# Patient Record
Sex: Male | Born: 1995 | Race: White | Hispanic: No | Marital: Single | Smoking: Former smoker
Health system: Southern US, Community
[De-identification: ages and names within clinical notes are randomized; demographics above are authoritative.]

## PROBLEM LIST (undated history)

## (undated) ENCOUNTER — Ambulatory Visit: Admission: EM | Payer: Medicaid Other | Source: Home / Self Care

## (undated) ENCOUNTER — Emergency Department (HOSPITAL_COMMUNITY): Payer: Medicaid Other

## (undated) DIAGNOSIS — F419 Anxiety disorder, unspecified: Secondary | ICD-10-CM

## (undated) DIAGNOSIS — F988 Other specified behavioral and emotional disorders with onset usually occurring in childhood and adolescence: Secondary | ICD-10-CM

## (undated) DIAGNOSIS — E783 Hyperchylomicronemia: Secondary | ICD-10-CM

## (undated) DIAGNOSIS — F445 Conversion disorder with seizures or convulsions: Secondary | ICD-10-CM

## (undated) DIAGNOSIS — J45909 Unspecified asthma, uncomplicated: Secondary | ICD-10-CM

## (undated) HISTORY — PX: WISDOM TOOTH EXTRACTION: SHX21

---

## 2018-11-29 ENCOUNTER — Emergency Department (HOSPITAL_BASED_OUTPATIENT_CLINIC_OR_DEPARTMENT_OTHER): Payer: Medicaid Other

## 2018-11-29 ENCOUNTER — Emergency Department (HOSPITAL_BASED_OUTPATIENT_CLINIC_OR_DEPARTMENT_OTHER)
Admission: EM | Admit: 2018-11-29 | Discharge: 2018-11-29 | Disposition: A | Discharge status: Home / Self Care | Payer: Medicaid Other | Attending: Emergency Medicine | Admitting: Emergency Medicine

## 2018-11-29 ENCOUNTER — Other Ambulatory Visit: Payer: Self-pay

## 2018-11-29 ENCOUNTER — Encounter (HOSPITAL_BASED_OUTPATIENT_CLINIC_OR_DEPARTMENT_OTHER): Payer: Self-pay | Admitting: Emergency Medicine

## 2018-11-29 DIAGNOSIS — R0602 Shortness of breath: Secondary | ICD-10-CM | POA: Diagnosis present

## 2018-11-29 DIAGNOSIS — U071 COVID-19: Secondary | ICD-10-CM

## 2018-11-29 HISTORY — DX: Other specified behavioral and emotional disorders with onset usually occurring in childhood and adolescence: F98.8

## 2018-11-29 HISTORY — DX: Conversion disorder with seizures or convulsions: F44.5

## 2018-11-29 HISTORY — DX: Hyperchylomicronemia: E78.3

## 2018-11-29 HISTORY — DX: Anxiety disorder, unspecified: F41.9

## 2018-11-29 MED ORDER — PREDNISONE 20 MG PO TABS: ORAL_TABLET | ORAL | 0 refills | Status: DC

## 2018-11-29 MED ORDER — AZITHROMYCIN 250 MG PO TABS: ORAL_TABLET | ORAL | 0 refills | Status: DC

## 2018-11-29 NOTE — Discharge Instructions (Addendum)
Take the medications as prescribed.  Make sure that you are quarantining at home for at least 10 days and at least 3 days after improvement of symptoms.  Use your albuterol inhaler as needed.  Return to emergency room if you have any worsening shortness of breath or other worsening symptoms.

## 2018-11-29 NOTE — ED Provider Notes (Signed)
[facility] EMERGENCY DEPARTMENT Provider Note   CSN: [id number] Arrival date & time: 11/29/18  1957    History   Chief Complaint Chief Complaint  Patient presents with  . Shortness of Breath    HPI Brett Larsen is a 23 y.o. male.     Patient is a 23 year old male who presents with shortness of breath.  He was recently diagnosed with COVID 2 days ago.  He has had symptoms of cough and fatigue for about the last 3 days.  He does have a history of asthma.  He had some increased shortness of breath today and he was using his [name] without significant improvement in symptoms.  He denies any known fevers.  No vomiting.  No leg swelling.     Past Medical History:  Diagnosis Date  . ADD (attention deficit disorder)   . Chronic anxiety   . Mixed hyperglyceridemia   . Psychogenic nonepileptic seizure     There are no active problems to display for this patient.   The histories are not reviewed yet. Please review them in the "History" navigator section and refresh this [place].      Home Medications    Prior to Admission medications   Medication Sig Start Date End Date Taking? Authorizing Provider  azithromycin (ZITHROMAX Z-PAK) 250 MG tablet 2 po day one, then 1 daily x 4 days 11/29/18   Malvin Johns, MD  predniSONE (DELTASONE) 20 MG tablet 3 tabs po day one, then 2 po daily x 4 days 11/29/18   Malvin Johns, MD    Family History History reviewed. No pertinent family history.  Social History Social History   Tobacco Use  . Smoking status: Not on file  Substance Use Topics  . Alcohol use: Not on file  . Drug use: Not on file     Allergies   Patient has no allergy information on record.   Review of Systems Review of Systems  Constitutional: Positive for fatigue. Negative for chills, diaphoresis and fever.  HENT: Negative for congestion, rhinorrhea and sneezing.   Eyes: Negative.   Respiratory: Positive for shortness of breath. Negative for  cough and chest tightness.   Cardiovascular: Negative for chest pain and leg swelling.  Gastrointestinal: Negative for abdominal pain, blood in stool, diarrhea, nausea and vomiting.  Genitourinary: Negative for difficulty urinating, flank pain, frequency and hematuria.  Musculoskeletal: Positive for myalgias. Negative for arthralgias and back pain.  Skin: Negative for rash.  Neurological: Negative for dizziness, speech difficulty, weakness, numbness and headaches.     Physical Exam Updated Vital Signs BP (!) 141/81 (BP Location: Right Arm)   Pulse 93   Temp 98.9 F (37.2 C) (Oral)   Resp 16   Ht 5\' 8"  (1.727 m)   Wt 113.4 kg   SpO2 97%   BMI 38.01 kg/m   Physical Exam Constitutional:      Appearance: He is well-developed.  HENT:     Head: Normocephalic and atraumatic.  Eyes:     Pupils: Pupils are equal, round, and reactive to light.  Neck:     Musculoskeletal: Normal range of motion and neck supple.  Cardiovascular:     Rate and Rhythm: Normal rate and regular rhythm.     Heart sounds: Normal heart sounds.  Pulmonary:     Effort: Pulmonary effort is normal. No respiratory distress.     Breath sounds: Normal breath sounds. No wheezing or rales.  Chest:     Chest wall: No tenderness.  Abdominal:     General: Bowel sounds are normal.     Palpations: Abdomen is soft.     Tenderness: There is no abdominal tenderness. There is no guarding or rebound.  Musculoskeletal: Normal range of motion.     Right lower leg: No edema.     Left lower leg: No edema.  Lymphadenopathy:     Cervical: No cervical adenopathy.  Skin:    General: Skin is warm and dry.     Findings: No rash.  Neurological:     Mental Status: He is alert and oriented to person, place, and time.      ED Treatments / Results  Labs (all labs ordered are listed, but only abnormal results are displayed) Labs Reviewed - No data to display  EKG None  [facility]  Result Date:  11/29/2018 CLINICAL DATA:  Shortness of breath.  COVID-19 positive. EXAM: PORTABLE CHEST 1 VIEW COMPARISON:  None. FINDINGS: The cardiomediastinal contours are normal. Minimal streaky left lung base opacities. Pulmonary vasculature is normal. No consolidation, pleural effusion, or pneumothorax. No acute osseous abnormalities are seen. IMPRESSION: Minimal streaky left lung base opacities, may represent atelectasis or known COVID-19 pneumonia. Electronically Signed   By: Narda RutherfordMelanie  Sanford M.D.   On: 11/29/2018 21:34    Procedures Procedures (including critical care time)  Medications Ordered in ED Medications - No data to display   Initial Impression / Assessment and Plan / ED Course  I have reviewed the triage vital signs and the nursing notes.  Pertinent labs & imaging results that were available during my care of the patient were reviewed by me and considered in my medical decision making (see chart for details).        Patient presents with shortness of breath after recently diagnosed COVID infection.  His chest x-ray shows some slight lower lobe patchy infiltrates.  He has normal oxygen saturation.  No tachypnea or increased work of breathing.  His lungs are clear.  I will start him on Zithromax and a prednisone burst.  He will continue using his albuterol at home as needed.  Return precautions were given.  Final Clinical Impressions(s) / ED Diagnoses   Final diagnoses:  COVID-19 virus infection    ED Discharge Orders         Ordered    azithromycin (ZITHROMAX Z-PAK) 250 MG tablet     11/29/18 2203    predniSONE (DELTASONE) 20 MG tablet     11/29/18 2203           Brett Larsen, Brett Heiner, MD 11/29/18 2205

## 2018-11-29 NOTE — ED Triage Notes (Signed)
Patient here with c/o SOB and light headedness. States he tested positive for COVID this past Friday. Hx of asthma. NAD. Afebrile since yesterday.

## 2018-11-29 NOTE — ED Notes (Signed)
Patient verbalizes understanding of discharge instructions. Opportunity for questioning and answers were provided. Armband removed by staff, pt discharged from ED.  

## 2020-01-12 ENCOUNTER — Emergency Department (HOSPITAL_COMMUNITY)
Admission: EM | Admit: 2020-01-12 | Discharge: 2020-01-12 | Disposition: A | Discharge status: Home / Self Care | Payer: Medicaid Other | Attending: Emergency Medicine | Admitting: Emergency Medicine

## 2020-01-12 ENCOUNTER — Emergency Department (HOSPITAL_COMMUNITY): Payer: Medicaid Other

## 2020-01-12 ENCOUNTER — Encounter (HOSPITAL_COMMUNITY): Payer: Self-pay

## 2020-01-12 DIAGNOSIS — Y929 Unspecified place or not applicable: Secondary | ICD-10-CM | POA: Insufficient documentation

## 2020-01-12 DIAGNOSIS — Z79899 Other long term (current) drug therapy: Secondary | ICD-10-CM | POA: Insufficient documentation

## 2020-01-12 DIAGNOSIS — W230XXA Caught, crushed, jammed, or pinched between moving objects, initial encounter: Secondary | ICD-10-CM | POA: Insufficient documentation

## 2020-01-12 DIAGNOSIS — Y999 Unspecified external cause status: Secondary | ICD-10-CM | POA: Insufficient documentation

## 2020-01-12 DIAGNOSIS — S60012A Contusion of left thumb without damage to nail, initial encounter: Secondary | ICD-10-CM | POA: Insufficient documentation

## 2020-01-12 DIAGNOSIS — Y939 Activity, unspecified: Secondary | ICD-10-CM | POA: Insufficient documentation

## 2020-01-12 DIAGNOSIS — S60932A Unspecified superficial injury of left thumb, initial encounter: Secondary | ICD-10-CM | POA: Diagnosis present

## 2020-01-12 NOTE — Discharge Instructions (Addendum)
Wear splint for comfort. Apply ice for 20 minutes at a time and elevate. Take Motrin and Tylenol as needed as directed. Follow up with orthopedics if not improving in 1 week.

## 2020-01-12 NOTE — ED Triage Notes (Signed)
Pt states that he slammed his left hand in the car door this am

## 2020-01-12 NOTE — ED Provider Notes (Signed)
[facility]-EMERGENCY DEPT Provider Note   CSN: [id number] Arrival date & time: 01/12/20  0542     History No chief complaint on file.   Brett Larsen is a 24 y.o. male.  24 year old right-hand-dominant male presents with injury to the left thumb.  Patient states that he closed his hand in the car door today and had to open the door to get his hand back out.  Pain is located at the distal left thumb.  No other injuries or concerns.        Past Medical History:  Diagnosis Date  . ADD (attention deficit disorder)   . Chronic anxiety   . Mixed hyperglyceridemia   . Psychogenic nonepileptic seizure     There are no problems to display for this patient.   History reviewed. No pertinent surgical history.     History reviewed. No pertinent family history.  Social History   Tobacco Use  . Smoking status: Never Smoker  . Smokeless tobacco: Never Used  Substance Use Topics  . Alcohol use: Never  . Drug use: Not on file    Home Medications Prior to Admission medications   Medication Sig Start Date End Date Taking? Authorizing Provider  azithromycin (ZITHROMAX Z-PAK) 250 MG tablet 2 po day one, then 1 daily x 4 days 11/29/18   Rolan Bucco, MD  predniSONE (DELTASONE) 20 MG tablet 3 tabs po day one, then 2 po daily x 4 days 11/29/18   Rolan Bucco, MD    Allergies    Patient has no allergy information on record.  Review of Systems   Review of Systems  Constitutional: Negative for fever.  Musculoskeletal: Positive for arthralgias, joint swelling and myalgias.  Skin: Negative for color change, rash and wound.  Allergic/Immunologic: Negative for immunocompromised state.  Neurological: Negative for weakness and numbness.  Hematological: Does not bruise/bleed easily.  Psychiatric/Behavioral: Negative for self-injury.  All other systems reviewed and are negative.   Physical Exam Updated Vital Signs BP 139/83   Pulse 65   Temp 97.8 F (36.6 C)  (Oral)   Resp 18   Ht 5\' 8"  (1.727 m)   Wt 86.2 kg   SpO2 93%   BMI 28.89 kg/m   Physical Exam Vitals and nursing note reviewed.  Constitutional:      General: He is not in acute distress.    Appearance: He is well-developed. He is not diaphoretic.  HENT:     Head: Normocephalic and atraumatic.  Cardiovascular:     Pulses: Normal pulses.  Pulmonary:     Effort: Pulmonary effort is normal.  Musculoskeletal:        General: Swelling and tenderness present. No deformity.     Comments: Tenderness to distal left thumb with mild swelling, slight petechia noted across left nail bed and palmar aspect. No subungual hematoma.   Skin:    General: Skin is warm and dry.     Capillary Refill: Capillary refill takes less than 2 seconds.     Findings: Bruising present. No erythema or rash.  Neurological:     Mental Status: He is alert and oriented to person, place, and time.     Sensory: No sensory deficit.  Psychiatric:        Behavior: Behavior normal.     ED Results / Procedures / Treatments   Labs (all labs ordered are listed, but only abnormal results are displayed) Labs Reviewed - No data to display  EKG None  Radiology DG Hand Complete  Left  Result Date: 01/12/2020 CLINICAL DATA:  Injury. EXAM: LEFT HAND - COMPLETE 3+ VIEW COMPARISON:  No prior. FINDINGS: No evidence of displaced fracture or dislocation. No radiopaque foreign body. IMPRESSION: No acute abnormality identified. Electronically Signed   By: Maisie Fus  Register   On: 01/12/2020 07:16    Procedures Procedures (including critical care time)  Medications Ordered in ED Medications - No data to display  ED Course  I have reviewed the triage vital signs and the nursing notes.  Pertinent labs & imaging results that were available during my care of the patient were reviewed by me and considered in my medical decision making (see chart for details).  Clinical Course as of Jan 11 837  Thu Jan 12, 2020  1323  24 year old right-hand-dominant male with injury to the left distal thumb.  On exam has slight petechia/early bruising across the nailbed and palmar aspect of the distal digit with mild swelling and tenderness. X-ray is negative for fracture.  Thumb will be splinted, recommend ice and elevate as well as Motrin and Tylenol, follow-up with orthopedics in 1 week if not improving.   [LM]    Clinical Course User Index [LM] Alden Hipp   MDM Rules/Calculators/A&P                          Final Clinical Impression(s) / ED Diagnoses Final diagnoses:  Contusion of left thumb without damage to nail, initial encounter    Rx / DC Orders ED Discharge Orders    None       Jeannie Fend, PA-C 01/12/20 5809    Maia Plan, MD 01/12/20 1052

## 2020-03-20 ENCOUNTER — Other Ambulatory Visit: Payer: Self-pay

## 2020-03-20 ENCOUNTER — Emergency Department (HOSPITAL_COMMUNITY)
Admission: EM | Admit: 2020-03-20 | Discharge: 2020-03-20 | Disposition: A | Discharge status: Home / Self Care | Payer: Medicaid Other | Attending: Emergency Medicine | Admitting: Emergency Medicine

## 2020-03-20 ENCOUNTER — Encounter (HOSPITAL_COMMUNITY): Payer: Self-pay | Admitting: *Deleted

## 2020-03-20 ENCOUNTER — Emergency Department (HOSPITAL_COMMUNITY): Payer: Medicaid Other

## 2020-03-20 DIAGNOSIS — F419 Anxiety disorder, unspecified: Secondary | ICD-10-CM | POA: Diagnosis not present

## 2020-03-20 DIAGNOSIS — Z87891 Personal history of nicotine dependence: Secondary | ICD-10-CM | POA: Diagnosis not present

## 2020-03-20 DIAGNOSIS — R0602 Shortness of breath: Secondary | ICD-10-CM | POA: Insufficient documentation

## 2020-03-20 DIAGNOSIS — R0789 Other chest pain: Secondary | ICD-10-CM | POA: Insufficient documentation

## 2020-03-20 DIAGNOSIS — R0600 Dyspnea, unspecified: Secondary | ICD-10-CM

## 2020-03-20 LAB — BASIC METABOLIC PANEL
Anion gap: 8 (ref 5–15)
BUN: 13 mg/dL (ref 6–20)
CO2: 25 mmol/L (ref 22–32)
Calcium: 9.7 mg/dL (ref 8.9–10.3)
Chloride: 107 mmol/L (ref 98–111)
Creatinine, Ser: 0.87 mg/dL (ref 0.61–1.24)
GFR, Estimated: >60 mL/min (ref >60)
Glucose, Bld: 83 mg/dL (ref 70–99)
Potassium: 4 mmol/L (ref 3.5–5.1)
Sodium: 140 mmol/L (ref 135–145)

## 2020-03-20 LAB — CBC
HCT: 44.7 % (ref 39.0–52.0)
Hemoglobin: 14.6 g/dL (ref 13.0–17.0)
MCH: 31.2 pg (ref 26.0–34.0)
MCHC: 32.7 g/dL (ref 30.0–36.0)
MCV: 95.5 fL (ref 80.0–100.0)
Platelets: 217 10*3/uL (ref 150–400)
RBC: 4.68 MIL/uL (ref 4.22–5.81)
RDW: 12.7 % (ref 11.5–15.5)
WBC: 6.2 10*3/uL (ref 4.0–10.5)
nRBC: 0 % (ref 0.0–0.2)

## 2020-03-20 LAB — TROPONIN I (HIGH SENSITIVITY): Troponin I (High Sensitivity): 3 ng/L (ref <18)

## 2020-03-20 NOTE — Discharge Instructions (Addendum)
Use your inhaler as directed if you become short of breath.  Keep your appointment with your doctor this week

## 2020-03-20 NOTE — ED Provider Notes (Signed)
MSE was initiated and I personally evaluated the patient and placed orders (if any) at  3:02 PM on March 20, 2020.  The patient appears stable so that the remainder of the MSE may be completed by another provider. 24 year old man seen at triage due to abnormal EKG.  Patient states that he has had shortness of breath over months although it has worsened over the past couple weeks.  Today he has had some sharp substernal chest pain.  EKG is abnormal with ST elevation in 2 3 aVF and across precordium.  This appears most consistent with early repolarization however there are no old EKGs to compare. Patient is having some chest pain. EKG reviewed via telephone 3 nursing staff with Dr. [place] who is concurrence with EKG was consistent with early repolarization.   Margarita Grizzle, MD 03/20/20 [id number]

## 2020-03-20 NOTE — ED Triage Notes (Addendum)
Pt states he has been having SHOB for 4-5 months, at school trying to finish project today and noticed shob was worse.No noted distress in triage resp 14-16.   Pt goes into more detail with Dr Rosalia Hammers stating he has chest pain that become worse as his shob increased.

## 2020-03-20 NOTE — ED Provider Notes (Signed)
[facility]-EMERGENCY DEPT Provider Note   CSN: [id number] Arrival date & time: 03/20/20  1355     History Chief Complaint  Patient presents with  . Shortness of Breath    Brett Larsen is a 24 y.o. male.  24 year old male who presents with increasing shortness of breath as well as some chest tightness.  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  He has been having these symptoms now for several months.  Is concerned about exposure to black mold in his apartment.  Denies any exertional component to this.  No lower extremity edema.  Patient states he works out every morning and has been able to continue to do this.  He has had an intentional 100 pound weight loss recently.  Patient states he also suffers from anxiety as well.  Symptoms wax and wane and resolved without treatment usually.  He also has a remote history of asthma and does have a inhaler which she did not use.  Chest tightness described as diffuse in nature without anginal type qualities.        Past Medical History:  Diagnosis Date  . ADD (attention deficit disorder)   . Chronic anxiety   . Mixed hyperglyceridemia   . Psychogenic nonepileptic seizure     There are no problems to display for this patient.   History reviewed. No pertinent surgical history.     No family history on file.  Social History   Tobacco Use  . Smoking status: Former Games developer  . Smokeless tobacco: Never Used  Substance Use Topics  . Alcohol use: Never  . Drug use: Never    Home Medications Prior to Admission medications   Medication Sig Start Date End Date Taking? Authorizing Provider  azithromycin (ZITHROMAX Z-PAK) 250 MG tablet 2 po day one, then 1 daily x 4 days 11/29/18   Rolan Bucco, MD  predniSONE (DELTASONE) 20 MG tablet 3 tabs po day one, then 2 po daily x 4 days 11/29/18   Rolan Bucco, MD    Allergies    Patient has no allergy information on  record.  Review of Systems   Review of Systems  All other systems reviewed and are negative.   Physical Exam Updated Vital Signs BP 130/74 (BP Location: Right Arm)   Pulse 64   Temp 98.4 F (36.9 C) (Oral)   Resp 16   Ht 1.727 m (5\' 8" )   Wt 73.9 kg   SpO2 98%   BMI 24.78 kg/m   Physical Exam Vitals and nursing note reviewed.  Constitutional:      General: He is not in acute distress.    Appearance: Normal appearance. He is well-developed. He is not toxic-appearing.  HENT:     Head: Normocephalic and atraumatic.  Eyes:     General: Lids are normal.     Conjunctiva/sclera: Conjunctivae normal.     Pupils: Pupils are equal, round, and reactive to light.  Neck:     Thyroid: No thyroid mass.     Trachea: No tracheal deviation.  Cardiovascular:     Rate and Rhythm: Normal rate and regular rhythm.     Heart sounds: Normal heart sounds. No murmur heard.  No gallop.   Pulmonary:     Effort: Pulmonary effort is normal. No respiratory distress.     Breath sounds: Normal breath sounds. No stridor. No decreased breath sounds, wheezing, rhonchi or rales.  Abdominal:  General: Bowel sounds are normal. There is no distension.     Palpations: Abdomen is soft.     Tenderness: There is no abdominal tenderness. There is no rebound.  Musculoskeletal:        General: No tenderness. Normal range of motion.     Cervical back: Normal range of motion and neck supple.  Skin:    General: Skin is warm and dry.     Findings: No abrasion or rash.  Neurological:     Mental Status: He is alert and oriented to person, place, and time.     GCS: GCS eye subscore is 4. GCS verbal subscore is 5. GCS motor subscore is 6.     Cranial Nerves: No cranial nerve deficit.     Sensory: No sensory deficit.  Psychiatric:        Speech: Speech normal.        Behavior: Behavior normal.     ED Results / Procedures / Treatments   Labs (all labs ordered are listed, but only abnormal results are  displayed) Labs Reviewed  CBC  BASIC METABOLIC PANEL  TROPONIN I (HIGH SENSITIVITY)    EKG EKG Interpretation  Date/Time:  Tuesday March 20 2020 14:02:42 EST Ventricular Rate:  63 PR Interval:    QRS Duration: 91 QT Interval:  390 QTC Calculation: 400 R Axis:   -57 Text Interpretation: Sinus rhythm likely early repol No old tracing to compare Confirmed by Margarita Grizzle [phone]) on 03/20/2020 2:18:50 PM   Radiology DG Chest Portable 1 View  Result Date: 03/20/2020 CLINICAL DATA:  24 year old male with shortness of breath. EXAM: PORTABLE CHEST 1 VIEW COMPARISON:  None. FINDINGS: The heart size and mediastinal contours are within normal limits. Both lungs are clear. The visualized skeletal structures are unremarkable. IMPRESSION: No acute cardiopulmonary process. Electronically Signed   By: Marliss Coots MD   On: 03/20/2020 14:33    Procedures Procedures (including critical care time)  Medications Ordered in ED Medications - No data to display  ED Course  I have reviewed the triage vital signs and the nursing notes.  Pertinent labs & imaging results that were available during my care of the patient were reviewed by me and considered in my medical decision making (see chart for details).    MDM Rules/Calculators/A&P                          Patient is EKG no dentures early repolarization.  Labs are reassuring including negative troponin.  Chest x-ray is normal as well 2.  Vital signs are stable.  Suspect some element of bronchospasm.  Have encouraged patient to use inhaler when he gets short of breath and to keep his follow-up appointment with his doctor later this week Final Clinical Impression(s) / ED Diagnoses Final diagnoses:  None    Rx / DC Orders ED Discharge Orders    None       Lorre Nick, MD 03/20/20 1638

## 2021-02-14 ENCOUNTER — Emergency Department (HOSPITAL_COMMUNITY)
Admission: EM | Admit: 2021-02-14 | Discharge: 2021-02-14 | Disposition: A | Discharge status: Home / Self Care | Payer: Medicaid Other | Attending: Emergency Medicine | Admitting: Emergency Medicine

## 2021-02-14 ENCOUNTER — Emergency Department (HOSPITAL_COMMUNITY): Payer: Medicaid Other

## 2021-02-14 ENCOUNTER — Encounter (HOSPITAL_COMMUNITY): Payer: Self-pay

## 2021-02-14 ENCOUNTER — Other Ambulatory Visit: Payer: Self-pay

## 2021-02-14 DIAGNOSIS — S62344A Nondisplaced fracture of base of fourth metacarpal bone, right hand, initial encounter for closed fracture: Secondary | ICD-10-CM | POA: Diagnosis not present

## 2021-02-14 DIAGNOSIS — Y9239 Other specified sports and athletic area as the place of occurrence of the external cause: Secondary | ICD-10-CM | POA: Diagnosis not present

## 2021-02-14 DIAGNOSIS — S6991XA Unspecified injury of right wrist, hand and finger(s), initial encounter: Secondary | ICD-10-CM | POA: Diagnosis present

## 2021-02-14 DIAGNOSIS — W228XXA Striking against or struck by other objects, initial encounter: Secondary | ICD-10-CM | POA: Insufficient documentation

## 2021-02-14 DIAGNOSIS — Z87891 Personal history of nicotine dependence: Secondary | ICD-10-CM | POA: Diagnosis not present

## 2021-02-14 NOTE — ED Notes (Signed)
Ortho tech bedside 

## 2021-02-14 NOTE — ED Triage Notes (Signed)
Pt stated he was using gym equipment and hurt his right hand. Some slight swelling noted to the top of right hand. Pt states its painful and hard to move his hand.

## 2021-02-14 NOTE — ED Notes (Signed)
An After Visit Summary was printed and given to the patient. Discharge instructions given and no further questions at this time.  

## 2021-02-14 NOTE — ED Provider Notes (Signed)
[facility]-EMERGENCY DEPT Provider Note   CSN: [id number] Arrival date & time: 02/14/21  1639     History Chief Complaint  Patient presents with   Right Hand Pain    Brett Larsen is a 25 y.o. male.  HPI Patient is a 25 year old male with a medical history as noted below.  Brett Larsen presents to the emergency department due to right hand pain.  Patient states that while at the gym Brett Larsen accidentally punched a wall with a closed right fist.  Reports immediate pain that Brett Larsen states is worse along the medial dorsum of the hand.  No numbness.  No other complaints.  Brett Larsen is right-hand dominant.    Past Medical History:  Diagnosis Date   ADD (attention deficit disorder)    Chronic anxiety    Mixed hyperglyceridemia    Psychogenic nonepileptic seizure     There are no problems to display for this patient.   History reviewed. No pertinent surgical history.     History reviewed. No pertinent family history.  Social History   Tobacco Use   Smoking status: Former   Smokeless tobacco: Never  Substance Use Topics   Alcohol use: Never   Drug use: Never    Home Medications Prior to Admission medications   Medication Sig Start Date End Date Taking? Authorizing Provider  azithromycin (ZITHROMAX Z-PAK) 250 MG tablet 2 po day one, then 1 daily x 4 days 11/29/18   Rolan Bucco, MD  predniSONE (DELTASONE) 20 MG tablet 3 tabs po day one, then 2 po daily x 4 days 11/29/18   Rolan Bucco, MD    Allergies    Patient has no known allergies.  Review of Systems   Review of Systems  Musculoskeletal:  Positive for arthralgias, joint swelling and myalgias.  Skin:  Negative for color change and wound.  Neurological:  Negative for weakness and numbness.   Physical Exam Updated Vital Signs BP (!) 151/82 (BP Location: Left Arm)   Pulse 95   Temp 98.6 F (37 C) (Oral)   Resp 17   Ht 5\' 8"  (1.727 m)   Wt 86.2 kg   SpO2 96%   BMI 28.89 kg/m   Physical Exam Vitals and  nursing note reviewed.  Constitutional:      General: Brett Larsen is not in acute distress.    Appearance: Brett Larsen is well-developed.  HENT:     Head: Normocephalic and atraumatic.     Right Ear: External ear normal.     Left Ear: External ear normal.  Eyes:     General: No scleral icterus.       Right eye: No discharge.        Left eye: No discharge.     Conjunctiva/sclera: Conjunctivae normal.  Neck:     Trachea: No tracheal deviation.  Cardiovascular:     Rate and Rhythm: Normal rate.  Pulmonary:     Effort: Pulmonary effort is normal. No respiratory distress.     Breath sounds: No stridor.  Abdominal:     General: There is no distension.  Musculoskeletal:        General: Swelling and tenderness present. No deformity.     Cervical back: Neck supple.     Comments: Moderate tenderness noted along the right fourth and fifth metacarpals.  Additional moderate tenderness along the right fourth MCP.  Patient wiggling all 5 fingers without difficulty.  Distal sensation intact in all 5 fingers.  Good cap refill.  2+ radial pulses.  No  tenderness appreciated in the right wrist.  Skin:    General: Skin is warm and dry.     Findings: No rash.  Neurological:     Mental Status: Brett Larsen is alert.     Cranial Nerves: Cranial nerve deficit: no gross deficits.   ED Results / Procedures / Treatments   Labs (all labs ordered are listed, but only abnormal results are displayed) Labs Reviewed - No data to display  EKG None  Radiology DG Hand Complete Right  Result Date: 02/14/2021 CLINICAL DATA:  Right hand pain and swelling. EXAM: RIGHT HAND - COMPLETE 3+ VIEW COMPARISON:  None. FINDINGS: There is an acute comminuted fracture at the base of the fourth metacarpal. Small free fracture fragment is is seen inferior to the metacarpophalangeal joint space dorsally which may represent triquetral fracture. There is no dislocation. There is soft tissue swelling over the dorsum of the hand. IMPRESSION: 1.  Acute  fracture base of the fourth metacarpal. 2. Questionable displaced fracture fragment from fourth metacarpal fracture versus additional triquetral fracture. Electronically Signed   By: Darliss Cheney M.D.   On: 02/14/2021 18:11    Procedures Procedures   Medications Ordered in ED Medications - No data to display  ED Course  I have reviewed the triage vital signs and the nursing notes.  Pertinent labs & imaging results that were available during my care of the patient were reviewed by me and considered in my medical decision making (see chart for details).    MDM Rules/Calculators/A&P                          Patient is a 25 year old male who presents to the emergency department with what appears to be an acute fracture of the base of the right fourth metacarpal as well as a questionable displaced fracture fragment from the fourth metacarpal versus additional triquetral fracture.  This occurred earlier today after accidentally punching a wall with a right closed fist.  Patient is right-hand dominant.  Physical exam significant for pain along the dorsum of the right hand along the fourth and fifth metacarpals.  Neurovascularly intact distal to the injury.  2+ radial pulses.  Will place patient in an ulnar gutter splint.  Patient given a referral to hand surgery.  Discussed return precautions.  Feel that Brett Larsen is stable for discharge at this time and Brett Larsen is agreeable.  His questions were answered and Brett Larsen was amicable at the time of discharge.  Final Clinical Impression(s) / ED Diagnoses Final diagnoses:  Closed nondisplaced fracture of base of fourth metacarpal bone of right hand, initial encounter   Rx / DC Orders ED Discharge Orders     None        Placido Sou, PA-C 02/14/21 1851    Arby Barrette, MD 02/25/21 1519

## 2021-02-14 NOTE — Discharge Instructions (Signed)
I recommend a combination of tylenol and ibuprofen for management of your pain. You can take a low dose of both at the same time. I recommend 500 mg of Tylenol combined with 600 mg of ibuprofen. This is one maximum strength Tylenol and three regular ibuprofen. You can take these 2-3 times for day for your pain. Please try to take these medications with a small amount of food as well to prevent upsetting your stomach.  Below is the contact information for Dr. Merlyn Lot.  Dr. Merlyn Lot is a local [employer].  Please give them a call tomorrow and schedule an appointment for reevaluation.  If you develop any new or worsening symptoms please come back to the emergency department.  It was a pleasure to meet you.

## 2021-02-18 ENCOUNTER — Other Ambulatory Visit: Payer: Self-pay

## 2021-02-18 ENCOUNTER — Other Ambulatory Visit: Payer: Self-pay | Admitting: Orthopedic Surgery

## 2021-02-18 ENCOUNTER — Encounter (HOSPITAL_BASED_OUTPATIENT_CLINIC_OR_DEPARTMENT_OTHER): Payer: Self-pay | Admitting: Orthopedic Surgery

## 2021-02-19 ENCOUNTER — Encounter (HOSPITAL_BASED_OUTPATIENT_CLINIC_OR_DEPARTMENT_OTHER): Payer: Self-pay | Admitting: Orthopedic Surgery

## 2021-02-19 ENCOUNTER — Ambulatory Visit (HOSPITAL_BASED_OUTPATIENT_CLINIC_OR_DEPARTMENT_OTHER): Payer: Medicaid Other | Admitting: Certified Registered"

## 2021-02-19 ENCOUNTER — Ambulatory Visit (HOSPITAL_BASED_OUTPATIENT_CLINIC_OR_DEPARTMENT_OTHER)
Admission: RE | Admit: 2021-02-19 | Discharge: 2021-02-19 | Disposition: A | Discharge status: Home / Self Care | Payer: Medicaid Other | Attending: Orthopedic Surgery | Admitting: Orthopedic Surgery

## 2021-02-19 ENCOUNTER — Encounter (HOSPITAL_BASED_OUTPATIENT_CLINIC_OR_DEPARTMENT_OTHER)
Admission: RE | Disposition: A | Discharge status: Home / Self Care | Payer: Self-pay | Source: Home / Self Care | Attending: Orthopedic Surgery

## 2021-02-19 ENCOUNTER — Other Ambulatory Visit: Payer: Self-pay

## 2021-02-19 ENCOUNTER — Ambulatory Visit (HOSPITAL_BASED_OUTPATIENT_CLINIC_OR_DEPARTMENT_OTHER): Payer: Medicaid Other

## 2021-02-19 DIAGNOSIS — W228XXA Striking against or struck by other objects, initial encounter: Secondary | ICD-10-CM | POA: Diagnosis not present

## 2021-02-19 DIAGNOSIS — Z87891 Personal history of nicotine dependence: Secondary | ICD-10-CM | POA: Diagnosis not present

## 2021-02-19 DIAGNOSIS — S63266A Dislocation of metacarpophalangeal joint of right little finger, initial encounter: Secondary | ICD-10-CM | POA: Insufficient documentation

## 2021-02-19 DIAGNOSIS — S62101A Fracture of unspecified carpal bone, right wrist, initial encounter for closed fracture: Secondary | ICD-10-CM | POA: Insufficient documentation

## 2021-02-19 DIAGNOSIS — S62634A Displaced fracture of distal phalanx of right ring finger, initial encounter for closed fracture: Secondary | ICD-10-CM | POA: Insufficient documentation

## 2021-02-19 HISTORY — PX: CLOSED REDUCTION METACARPAL WITH PERCUTANEOUS PINNING: SHX5613

## 2021-02-19 HISTORY — DX: Unspecified asthma, uncomplicated: J45.909

## 2021-02-19 SURGERY — CLOSED REDUCTION, FRACTURE, METACARPAL BONE, WITH PERCUTANEOUS PINNING
Anesthesia: General | Site: Hand | Laterality: Right

## 2021-02-19 MED ORDER — ONDANSETRON HCL 4 MG/2ML IJ SOLN
INTRAMUSCULAR | Status: DC | PRN
  Administered 2021-02-19: 4 mg via INTRAVENOUS

## 2021-02-19 MED ORDER — FENTANYL CITRATE (PF) 100 MCG/2ML IJ SOLN
INTRAMUSCULAR | Status: AC
  Filled 2021-02-19: qty 2

## 2021-02-19 MED ORDER — MIDAZOLAM HCL 5 MG/5ML IJ SOLN
INTRAMUSCULAR | Status: DC | PRN
  Administered 2021-02-19: 2 mg via INTRAVENOUS

## 2021-02-19 MED ORDER — GLYCOPYRROLATE PF 0.2 MG/ML IJ SOSY
PREFILLED_SYRINGE | INTRAMUSCULAR | Status: AC
  Filled 2021-02-19: qty 2

## 2021-02-19 MED ORDER — AMISULPRIDE (ANTIEMETIC) 5 MG/2ML IV SOLN: 10.0000 mg | Freq: Once | INTRAVENOUS | Status: DC | PRN

## 2021-02-19 MED ORDER — CEFAZOLIN SODIUM-DEXTROSE 2-4 GM/100ML-% IV SOLN
INTRAVENOUS | Status: AC
  Filled 2021-02-19: qty 100

## 2021-02-19 MED ORDER — MIDAZOLAM HCL 2 MG/2ML IJ SOLN
INTRAMUSCULAR | Status: AC
  Filled 2021-02-19: qty 2

## 2021-02-19 MED ORDER — EPHEDRINE SULFATE 50 MG/ML IJ SOLN
INTRAMUSCULAR | Status: DC | PRN
  Administered 2021-02-19: 10 mg via INTRAVENOUS

## 2021-02-19 MED ORDER — HYDROCODONE-ACETAMINOPHEN 5-325 MG PO TABS: ORAL_TABLET | ORAL | 0 refills | Status: DC

## 2021-02-19 MED ORDER — CEFAZOLIN SODIUM-DEXTROSE 2-4 GM/100ML-% IV SOLN
2.0000 g | INTRAVENOUS | Status: AC
  Administered 2021-02-19: 2 g via INTRAVENOUS

## 2021-02-19 MED ORDER — BUPIVACAINE HCL (PF) 0.25 % IJ SOLN
INTRAMUSCULAR | Status: DC | PRN
  Administered 2021-02-19: 10 mL

## 2021-02-19 MED ORDER — PROMETHAZINE HCL 25 MG/ML IJ SOLN: 6.2500 mg | INTRAMUSCULAR | Status: DC | PRN

## 2021-02-19 MED ORDER — ONDANSETRON HCL 4 MG/2ML IJ SOLN
INTRAMUSCULAR | Status: AC
  Filled 2021-02-19: qty 2

## 2021-02-19 MED ORDER — MEPERIDINE HCL 25 MG/ML IJ SOLN: 6.2500 mg | INTRAMUSCULAR | Status: DC | PRN

## 2021-02-19 MED ORDER — DEXMEDETOMIDINE (PRECEDEX) IN NS 20 MCG/5ML (4 MCG/ML) IV SYRINGE
PREFILLED_SYRINGE | INTRAVENOUS | Status: DC | PRN
  Administered 2021-02-19 (×2): 8 ug via INTRAVENOUS
  Administered 2021-02-19: 4 ug via INTRAVENOUS

## 2021-02-19 MED ORDER — PROPOFOL 10 MG/ML IV BOLUS
INTRAVENOUS | Status: DC | PRN
  Administered 2021-02-19: 50 mg via INTRAVENOUS
  Administered 2021-02-19: 300 mg via INTRAVENOUS

## 2021-02-19 MED ORDER — HYDROMORPHONE HCL 1 MG/ML IJ SOLN: 0.2500 mg | INTRAMUSCULAR | Status: DC | PRN

## 2021-02-19 MED ORDER — LIDOCAINE 2% (20 MG/ML) 5 ML SYRINGE
INTRAMUSCULAR | Status: AC
  Filled 2021-02-19: qty 5

## 2021-02-19 MED ORDER — OXYCODONE HCL 5 MG/5ML PO SOLN: 5.0000 mg | Freq: Once | ORAL | Status: DC | PRN

## 2021-02-19 MED ORDER — DEXAMETHASONE SODIUM PHOSPHATE 4 MG/ML IJ SOLN
INTRAMUSCULAR | Status: DC | PRN
  Administered 2021-02-19: 8 mg via INTRAVENOUS

## 2021-02-19 MED ORDER — LACTATED RINGERS IV SOLN: INTRAVENOUS | Status: DC

## 2021-02-19 MED ORDER — EPHEDRINE 5 MG/ML INJ
INTRAVENOUS | Status: AC
  Filled 2021-02-19: qty 5

## 2021-02-19 MED ORDER — FENTANYL CITRATE (PF) 100 MCG/2ML IJ SOLN
INTRAMUSCULAR | Status: DC | PRN
  Administered 2021-02-19: 100 ug via INTRAVENOUS
  Administered 2021-02-19 (×2): 50 ug via INTRAVENOUS

## 2021-02-19 MED ORDER — LIDOCAINE 2% (20 MG/ML) 5 ML SYRINGE
INTRAMUSCULAR | Status: DC | PRN
  Administered 2021-02-19: 60 mg via INTRAVENOUS

## 2021-02-19 MED ORDER — DEXMEDETOMIDINE (PRECEDEX) IN NS 20 MCG/5ML (4 MCG/ML) IV SYRINGE
PREFILLED_SYRINGE | INTRAVENOUS | Status: AC
  Filled 2021-02-19: qty 5

## 2021-02-19 MED ORDER — OXYCODONE HCL 5 MG PO TABS: 5.0000 mg | ORAL_TABLET | Freq: Once | ORAL | Status: DC | PRN

## 2021-02-19 SURGICAL SUPPLY — 54 items
APL PRP STRL LF DISP 70% ISPRP (MISCELLANEOUS) ×1
BLADE MINI RND TIP GREEN BEAV (BLADE) IMPLANT
BLADE SURG 15 STRL LF DISP TIS (BLADE) ×1 IMPLANT
BLADE SURG 15 STRL SS (BLADE) ×2
BNDG CMPR 9X4 STRL LF SNTH (GAUZE/BANDAGES/DRESSINGS) ×1
BNDG ELASTIC 2X5.8 VLCR STR LF (GAUZE/BANDAGES/DRESSINGS) IMPLANT
BNDG ELASTIC 3X5.8 VLCR STR LF (GAUZE/BANDAGES/DRESSINGS) ×2 IMPLANT
BNDG ESMARK 4X9 LF (GAUZE/BANDAGES/DRESSINGS) ×2 IMPLANT
BNDG GAUZE ELAST 4 BULKY (GAUZE/BANDAGES/DRESSINGS) ×2 IMPLANT
CHLORAPREP W/TINT 26 (MISCELLANEOUS) ×2 IMPLANT
CORD BIPOLAR FORCEPS 12FT (ELECTRODE) IMPLANT
COVER BACK TABLE 60X90IN (DRAPES) ×2 IMPLANT
COVER MAYO STAND STRL (DRAPES) ×2 IMPLANT
CUFF TOURN SGL QUICK 18X4 (TOURNIQUET CUFF) ×2 IMPLANT
DRAPE EXTREMITY T 121X128X90 (DISPOSABLE) ×2 IMPLANT
DRAPE OEC MINIVIEW 54X84 (DRAPES) ×2 IMPLANT
DRAPE SURG 17X23 STRL (DRAPES) ×2 IMPLANT
GAUZE SPONGE 4X4 12PLY STRL (GAUZE/BANDAGES/DRESSINGS) ×2 IMPLANT
GAUZE XEROFORM 1X8 LF (GAUZE/BANDAGES/DRESSINGS) ×2 IMPLANT
GLOVE SRG 8 PF TXTR STRL LF DI (GLOVE) ×1 IMPLANT
GLOVE SURG ENC MOIS LTX SZ7.5 (GLOVE) ×2 IMPLANT
GLOVE SURG POLYISO LF SZ6.5 (GLOVE) ×2 IMPLANT
GLOVE SURG UNDER POLY LF SZ7 (GLOVE) ×4 IMPLANT
GLOVE SURG UNDER POLY LF SZ8 (GLOVE) ×2
GOWN STRL REUS W/ TWL LRG LVL3 (GOWN DISPOSABLE) ×1 IMPLANT
GOWN STRL REUS W/TWL LRG LVL3 (GOWN DISPOSABLE) ×2
GOWN STRL REUS W/TWL XL LVL3 (GOWN DISPOSABLE) ×2 IMPLANT
K-WIRE .045X4 (WIRE) ×6 IMPLANT
NEEDLE HYPO 22GX1.5 SAFETY (NEEDLE) IMPLANT
NEEDLE HYPO 25X1 1.5 SAFETY (NEEDLE) ×2 IMPLANT
NS IRRIG 1000ML POUR BTL (IV SOLUTION) ×2 IMPLANT
PACK BASIN DAY SURGERY FS (CUSTOM PROCEDURE TRAY) ×2 IMPLANT
PAD CAST 3X4 CTTN HI CHSV (CAST SUPPLIES) ×1 IMPLANT
PAD CAST 4YDX4 CTTN HI CHSV (CAST SUPPLIES) IMPLANT
PADDING CAST ABS 4INX4YD NS (CAST SUPPLIES) ×1
PADDING CAST ABS COTTON 4X4 ST (CAST SUPPLIES) ×1 IMPLANT
PADDING CAST COTTON 3X4 STRL (CAST SUPPLIES) ×2
PADDING CAST COTTON 4X4 STRL (CAST SUPPLIES)
SLEEVE SCD COMPRESS KNEE MED (STOCKING) ×2 IMPLANT
SPLINT PLASTER CAST XFAST 3X15 (CAST SUPPLIES) IMPLANT
SPLINT PLASTER CAST XFAST 4X15 (CAST SUPPLIES) IMPLANT
SPLINT PLASTER XTRA FAST SET 4 (CAST SUPPLIES)
SPLINT PLASTER XTRA FASTSET 3X (CAST SUPPLIES)
STOCKINETTE 4X48 STRL (DRAPES) ×2 IMPLANT
SUT ETHILON 3 0 PS 1 (SUTURE) IMPLANT
SUT ETHILON 4 0 PS 2 18 (SUTURE) IMPLANT
SUT MERSILENE 4 0 P 3 (SUTURE) IMPLANT
SUT VIC AB 3-0 PS1 18 (SUTURE)
SUT VIC AB 3-0 PS1 18XBRD (SUTURE) IMPLANT
SUT VICRYL 4-0 PS2 18IN ABS (SUTURE) IMPLANT
SYR BULB EAR ULCER 3OZ GRN STR (SYRINGE) IMPLANT
SYR CONTROL 10ML LL (SYRINGE) ×2 IMPLANT
TOWEL GREEN STERILE FF (TOWEL DISPOSABLE) ×4 IMPLANT
UNDERPAD 30X36 HEAVY ABSORB (UNDERPADS AND DIAPERS) ×2 IMPLANT

## 2021-02-19 NOTE — Discharge Instructions (Addendum)

## 2021-02-19 NOTE — Op Note (Signed)
I assisted Surgeon(s) and Role:    * Betha Loa, MD - Primary    Cindee Salt, MD - Assisting on the Procedure(s): CLOSED REDUCTION METACARPAL WITH PERCUTANEOUS PINNING RIGHT RING AND SMALL METACARPAL BASE FRACTURE DISLOCATION on 02/19/2021.  I provided assistance on this case as follows: setup, support,stabilization after the reduction of the fractures for pinning, application of the dressings and splints.  Electronically signed by: Cindee Salt, MD Date: 02/19/2021 Time: 3:40 PM

## 2021-02-19 NOTE — Anesthesia Procedure Notes (Signed)
Procedure Name: LMA Insertion Date/Time: 02/19/2021 3:02 PM Performed by: Alford Highland, CRNA Pre-anesthesia Checklist: Patient identified, Emergency Drugs available, Suction available and Patient being monitored Patient Re-evaluated:Patient Re-evaluated prior to induction Oxygen Delivery Method: Circle System Utilized Preoxygenation: Pre-oxygenation with 100% oxygen Induction Type: IV induction Ventilation: Mask ventilation without difficulty LMA: LMA inserted LMA Size: 5.0 Number of attempts: 1 Airway Equipment and Method: Bite block Placement Confirmation: positive ETCO2 Tube secured with: Tape Dental Injury: Teeth and Oropharynx as per pre-operative assessment

## 2021-02-19 NOTE — Op Note (Signed)
NAME: Brett Larsen MEDICAL RECORD NO: [id number] DATE OF BIRTH: [birth year] FACILITY: [facility] LOCATION: [facility] PHYSICIAN: Tami Ribas, MD   OPERATIVE REPORT   DATE OF PROCEDURE: 02/19/21    PREOPERATIVE DIAGNOSIS: Right ring finger metacarpal base fracture, right small finger CMC dislocation, right hamate rim fracture   POSTOPERATIVE DIAGNOSIS:  Right ring finger metacarpal base fracture, right small finger CMC dislocation, right hamate rim fracture   PROCEDURE: 1.  Closed duction percutaneous pinning right ring finger metacarpal base fracture 2.  Closed reduction percutaneous pinning right small finger CMC dislocation 3.  Closed reduction right hamate rim fracture   SURGEON:  Betha Loa, M.D.   ASSISTANT: Cindee Salt, MD   ANESTHESIA:  General   INTRAVENOUS FLUIDS:  Per anesthesia flow sheet.   ESTIMATED BLOOD LOSS:  Minimal.   COMPLICATIONS:  None.   SPECIMENS:  none   TOURNIQUET TIME:  None   DISPOSITION:  Stable to PACU.   INDICATIONS: 25 year old male states he punched a wall proximally 1 week ago injuring his right hand.  Was seen at the emergency department where radiographs were taken revealing a fracture of the base of the ring finger metacarpal and dislocation of the [facility] joint of the small finger with fracture of the dorsal rim of the hamate.  He was splinted and followed up in the office.  He wishes to proceed with operative reduction and fixation.  Risks, benefits and alternatives of surgery were discussed including the risks of blood loss, infection, damage to nerves, vessels, tendons, ligaments, bone for surgery, need for additional surgery, complications with wound healing, continued pain, nonunion, malunion,  stiffness.  He voiced understanding of these risks and elected to proceed.  OPERATIVE COURSE:  After being identified preoperatively by myself,  the patient and I agreed on the procedure and site of the procedure.  The surgical site  was marked.  Surgical consent had been signed. He was given IV antibiotics as preoperative antibiotic prophylaxis. He was transferred to the operating room and placed on the operating table in supine position with the Right upper extremity on an arm board.  General anesthesia was induced by the anesthesiologist.  Right upper extremity was prepped and draped in normal sterile orthopedic fashion.  A surgical pause was performed between the surgeons, anesthesia, and operating room staff and all were in agreement as to the patient, procedure, and site of procedure.  Tourniquet was not inflated.  C-arm was used in AP lateral and oblique projections throughout the case.  A closed reduction of the right small finger CMC dislocation and ring finger metacarpal base fracture was performed.  There was good reduction of the small finger CMC joint.  The ring finger metacarpal fracture would reduce but was difficult to bring out full length.  The hamate rim fracture was also reduced.  A 0.045 inch K wire was advanced in an oblique fashion across the small finger metacarpal base into the body of the hamate.  An additional 0.045 inch K wire was advanced obliquely across the fracture at the base of the ring finger metacarpal and into the base of the long finger metacarpal.  When placing the wrist through tenodesis there is crowding of the ring and small fingers toward the long finger.  The pins were backed out and the reduction adjusted.  The pins were then advanced again.  There was better alignment of the fingers with the wrist placed through tenodesis.  This C-arm was used in AP lateral and  oblique projections to ensure appropriate reduction and position of the hardware which was the case.  An additional 0.045 inch K wire was advanced across the bases of the small ring and long finger metacarpals to prevent any rotation.  C-arm was again used in AP lateral and oblique projections to ensure appropriate reduction position of heart  which was the case.  The wrist was placed through tenodesis and there was no scissoring with good alignment of the digits.  The pins were all bent and cut short.  The pin sites were dressed with sterile Xeroform 4 x 4's and wrapped with a Kerlix bandage.  A volar dorsal slab splint including the long ring and small fingers was placed with the MPs flexed and the IP is extended.  This was wrapped with Kerlix and Ace bandage.  Fingertips were pink with brisk capillary refill at completion of the case.  The operative  drapes were broken down.  The patient was awoken from anesthesia safely.  He was transferred back to the stretcher and taken to PACU in stable condition.  I will see him back in the office in 1 week for postoperative followup.  I will give him a prescription for Norco 5/325 1-2 tabs PO q6 hours prn pain, dispense # 20.   Betha Loa, MD Electronically signed, 02/19/21

## 2021-02-19 NOTE — H&P (Signed)
Brett Larsen is an 25 y.o. male.   Chief Complaint: right hand fracture HPI: 25 yo rhd male states he injured right hand when he punched a wall 02/14/21.  Seen at ED where XR revealed right ring and small finger metacarpal base fracture/cmc dislocation with hamate rim fracture.  Splinted and followed up in office.  He wishes to proceed with operative reduction and fixation.  Allergies: No Known Allergies  Past Medical History:  Diagnosis Date   ADD (attention deficit disorder)    Asthma    Chronic anxiety    Mixed hyperglyceridemia    Psychogenic nonepileptic seizure     Past Surgical History:  Procedure Laterality Date   WISDOM TOOTH EXTRACTION      Family History: History reviewed. No pertinent family history.  Social History:   reports that he has quit smoking. He has never used smokeless tobacco. He reports that he does not currently use alcohol. He reports current drug use. Drug: Other-see comments.  Medications: Medications Prior to Admission  Medication Sig Dispense Refill   albuterol (VENTOLIN HFA) 108 (90 Base) MCG/ACT inhaler Inhale into the lungs every 6 (six) hours as needed for wheezing or shortness of breath.     ASHWAGANDHA PO Take 600 mg by mouth.     Magnesium Oxide (MAG-CAPS PO) Take 500 mg by mouth.     Multiple Vitamin (MULTIVITAMIN ADULT PO) Take by mouth.     Omega-3 Fatty Acids ([employer] NATURAL FISH OIL PO) Take 2,000 Units by mouth.     Potassium 99 MG TABS Take by mouth.     valACYclovir (VALTREX) 1000 MG tablet Take 1,000 mg by mouth 2 (two) times daily.     Zinc 50 MG CAPS Take by mouth.      No results found for this or any previous visit (from the past 48 hour(s)).  DG MINI C-ARM IMAGE ONLY  Result Date: 02/19/2021 There is no interpretation for this exam.  This order is for images obtained during a surgical procedure.  Please See "Surgeries" Tab for more information regarding the procedure.      Blood pressure 136/70, pulse 76, temperature 98.6  F (37 C), temperature source Oral, resp. rate 18, height 5\' 8"  (1.727 m), weight 86.2 kg, SpO2 98 %.  General appearance: alert, cooperative, and appears stated age Head: Normocephalic, without obvious abnormality, atraumatic Neck: supple, symmetrical, trachea midline Cardio: regular rate and rhythm Resp: clear to auscultation bilaterally Extremities: Intact sensation and capillary refill all digits.  +epl/fpl/io.  No wounds.  Pulses: 2+ and symmetric Skin: Skin color, texture, turgor normal. No rashes or lesions Neurologic: Grossly normal Incision/Wound: none  Assessment/Plan Right ring and small metacarpal/cmc fracture dislocation with hamate rim fracture.  Plan reduction and pinning.  Non operative and operative treatment options have been discussed with the patient and patient wishes to proceed with operative treatment. Risks, benefits, and alternatives of surgery have been discussed and the patient agrees with the plan of care.   02/19/2021, 2:34 PM

## 2021-02-19 NOTE — Transfer of Care (Signed)
Immediate Anesthesia Transfer of Care Note  Patient: Brett Larsen  Procedure(s) Performed: CLOSED REDUCTION METACARPAL WITH PERCUTANEOUS PINNING RIGHT RING AND SMALL METACARPAL BASE FRACTURE DISLOCATION (Right: Hand)  Patient Location: PACU  Anesthesia Type:General  Level of Consciousness: drowsy  Airway & Oxygen Therapy: Patient Spontanous Breathing and Patient connected to face mask oxygen  Post-op Assessment: Report given to RN and Post -op Vital signs reviewed and stable  Post vital signs: Reviewed and stable  Last Vitals:  Vitals Value Taken Time  BP 107/46 02/19/21 1542  Temp    Pulse 62 02/19/21 1544  Resp 13 02/19/21 1544  SpO2 98 % 02/19/21 1544  Vitals shown include unvalidated device data.  Last Pain:  Vitals:   02/19/21 1329  TempSrc: Oral  PainSc: 0-No pain      Patients Stated Pain Goal: 3 (02/19/21 1329)  Complications: No notable events documented.

## 2021-02-19 NOTE — Anesthesia Preprocedure Evaluation (Addendum)
Anesthesia Evaluation  Patient identified by MRN, date of birth, ID band Patient awake    Reviewed: Allergy & Precautions, NPO status , Patient's Chart, lab work & pertinent test results  Airway Mallampati: II  TM Distance: >3 FB Neck ROM: Full    Dental no notable dental hx.    Pulmonary asthma , former smoker,    Pulmonary exam normal breath sounds clear to auscultation       Cardiovascular negative cardio ROS Normal cardiovascular exam Rhythm:Regular Rate:Normal     Neuro/Psych Anxiety negative neurological ROS  negative psych ROS   GI/Hepatic negative GI ROS, Neg liver ROS,   Endo/Other  negative endocrine ROS  Renal/GU negative Renal ROS  negative genitourinary   Musculoskeletal negative musculoskeletal ROS (+)   Abdominal   Peds negative pediatric ROS (+)  Hematology negative hematology ROS (+)   Anesthesia Other Findings   Reproductive/Obstetrics negative OB ROS                             Anesthesia Physical Anesthesia Plan  ASA: 2  Anesthesia Plan: General   Post-op Pain Management:    Induction: Intravenous  PONV Risk Score and Plan: 2 and Ondansetron, Midazolam and Treatment may vary due to age or medical condition  Airway Management Planned: LMA  Additional Equipment:   Intra-op Plan:   Post-operative Plan: Extubation in OR  Informed Consent: I have reviewed the patients History and Physical, chart, labs and discussed the procedure including the risks, benefits and alternatives for the proposed anesthesia with the patient or authorized representative who has indicated his/her understanding and acceptance.     Dental advisory given  Plan Discussed with: CRNA  Anesthesia Plan Comments:         Anesthesia Quick Evaluation

## 2021-02-20 NOTE — Anesthesia Postprocedure Evaluation (Signed)
Anesthesia Post Note  Patient: Knut Rondinelli  Procedure(s) Performed: CLOSED REDUCTION METACARPAL WITH PERCUTANEOUS PINNING RIGHT RING AND SMALL METACARPAL BASE FRACTURE DISLOCATION (Right: Hand)     Patient location during evaluation: PACU Anesthesia Type: General Level of consciousness: awake and alert Pain management: pain level controlled Vital Signs Assessment: post-procedure vital signs reviewed and stable Respiratory status: spontaneous breathing, nonlabored ventilation, respiratory function stable and patient connected to nasal cannula oxygen Cardiovascular status: blood pressure returned to baseline and stable Postop Assessment: no apparent nausea or vomiting Anesthetic complications: no Comments: Called to PACU for seizure like activity. Per nursing, pt was sitting up in bed and then became unresponsive, eyes rolled back, and began shaking. Upon my arrival, pt was responsive. Airway intact. VSS. No obvious post-ictal state as patient remembers events. States he has had similar episodes in the past induced by stress. States this episode is the same as previous episodes. In 2020, he was diagnosed with psychogenic nonepileptic seizures. Neurological workup at that time was negative. He is currently not taking anti-epileptics. Pt remained in PACU for observation. No further episodes occurred. Pt amenable to discharge. Discharged in stable condition.    No notable events documented.  Last Vitals:  Vitals:   02/19/21 1644 02/19/21 1718  BP: (!) 139/96 135/86  Pulse: 67 70  Resp: 16 16  Temp:  36.6 C  SpO2: 99% 95%    Last Pain:  Vitals:   02/19/21 1718  TempSrc:   PainSc: 4    Pain Goal: Patients Stated Pain Goal: 3 (02/19/21 1329)                 Kimberlye Dilger L Allice Garro

## 2021-02-25 ENCOUNTER — Encounter (HOSPITAL_BASED_OUTPATIENT_CLINIC_OR_DEPARTMENT_OTHER): Payer: Self-pay | Admitting: Orthopedic Surgery

## 2021-05-23 ENCOUNTER — Other Ambulatory Visit: Payer: Self-pay

## 2021-05-23 ENCOUNTER — Emergency Department (HOSPITAL_COMMUNITY): Payer: Medicaid Other

## 2021-05-23 ENCOUNTER — Emergency Department (HOSPITAL_COMMUNITY)
Admission: EM | Admit: 2021-05-23 | Discharge: 2021-05-23 | Disposition: A | Discharge status: Home / Self Care | Payer: Medicaid Other | Attending: Emergency Medicine | Admitting: Emergency Medicine

## 2021-05-23 ENCOUNTER — Encounter (HOSPITAL_COMMUNITY): Payer: Self-pay

## 2021-05-23 DIAGNOSIS — S060X0D Concussion without loss of consciousness, subsequent encounter: Secondary | ICD-10-CM | POA: Insufficient documentation

## 2021-05-23 DIAGNOSIS — Y9372 Activity, wrestling: Secondary | ICD-10-CM | POA: Insufficient documentation

## 2021-05-23 DIAGNOSIS — S0990XD Unspecified injury of head, subsequent encounter: Secondary | ICD-10-CM | POA: Diagnosis present

## 2021-05-23 MED ORDER — KETOROLAC TROMETHAMINE 15 MG/ML IJ SOLN
15.0000 mg | Freq: Once | INTRAMUSCULAR | Status: AC
  Administered 2021-05-23: 15 mg via INTRAMUSCULAR
  Filled 2021-05-23: qty 1
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
## 2021-05-23 NOTE — Discharge Instructions (Signed)
And imaging was negative, indicated this is most likely concussion. This should be treated symptomatically with Tylenol and ibuprofen for pain. Use.  Rest, avoid things that make your head hurt worse.  This may include lights, loud noises, or screens. Follow-up with your primary care doctor for recheck of your symptoms. Return to the emergency room with any new, worsening, or concerning symptoms.
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
## 2021-06-23 ENCOUNTER — Emergency Department (HOSPITAL_COMMUNITY)
Admission: EM | Admit: 2021-06-23 | Discharge: 2021-06-23 | Disposition: A | Discharge status: Home / Self Care | Payer: Medicaid Other | Attending: Emergency Medicine | Admitting: Emergency Medicine

## 2021-06-23 ENCOUNTER — Emergency Department (HOSPITAL_COMMUNITY): Payer: Medicaid Other

## 2021-06-23 ENCOUNTER — Encounter (HOSPITAL_COMMUNITY): Payer: Self-pay | Admitting: Emergency Medicine

## 2021-06-23 ENCOUNTER — Other Ambulatory Visit: Payer: Self-pay

## 2021-06-23 DIAGNOSIS — X58XXXA Exposure to other specified factors, initial encounter: Secondary | ICD-10-CM | POA: Diagnosis not present

## 2021-06-23 DIAGNOSIS — S0990XA Unspecified injury of head, initial encounter: Secondary | ICD-10-CM | POA: Insufficient documentation

## 2021-06-23 DIAGNOSIS — H538 Other visual disturbances: Secondary | ICD-10-CM | POA: Diagnosis not present

## 2021-06-23 MED ORDER — ACETAMINOPHEN 325 MG PO TABS
650.0000 mg | ORAL_TABLET | Freq: Once | ORAL | Status: DC
  Filled 2021-06-23: qty 2

## 2021-06-23 MED ORDER — TETRACAINE HCL 0.5 % OP SOLN
1.0000 [drp] | Freq: Once | OPHTHALMIC | Status: AC
  Administered 2021-06-23: 1 [drp] via OPHTHALMIC
  Filled 2021-06-23: qty 4
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
## 2021-06-23 NOTE — Discharge Instructions (Addendum)
It was a pleasure taking care of you today. As discussed, your CT scan was negative for any acute abnormalities. You could have sustained a concussion. Attempt to limit screen time for the next few days. You may take over the counter ibuprofen or Tylenol as needed for pain.  I have included the number of the concussion clinic and eye doctor.  Call tomorrow to schedule an appointment for further evaluation.  Return to the ER for new or worsening symptoms.
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
## 2021-06-25 NOTE — Progress Notes (Signed)
Aleen Sells D.[facility] [address] [place] [address] Phone: [phone]  Assessment and Plan:     1. Concussion without loss of consciousness, initial encounter -Acute, uncertain prognosis, initial sports medicine visit - Concussion diagnosed based off of HPI, physical exam, symptom severity score, special testing - Unclear if patient has suffered from 2 individual concussions or 1 concussion with reaggravation.  Based on timeline, I feel that it is more likely that patient had 1 concussion that was not fully healed, and was reaggravated by sparring based on not having a new significant head trauma that patient is aware of during second incident - Stressed the importance of no strenuous physical activity and no sparring for the next 1 week - Handout provided on red flag symptoms and information packet.  No red flag symptoms at today's visit, so no additional testing - CT head unremarkable at ER visit on 06/23/2021 - Based on chart review patient appears to had a concussion in 03/2020, and new concussion in 05/2021  2. Anxiety state -chronic with exacerbation - Patient states that he has chronic anxiety, though has not been treated for this condition - Plans on establishing care visit with behavioral health next week - Current anxiety state is likely exacerbated by concussion   Date of injury was 05/23/2021  and  06/23/2021. Original symptom severity scores were 19 and 69. The patient was counseled on the nature of the injury, typical course and potential options for further evaluation and treatment. Discussed the importance of compliance with recommendations. Patient stated understanding of this plan and willingness to comply.  Recommendations:  -  Complete mental and physical rest for 48 hours after concussive event - Recommend light aerobic activity while keeping symptoms less than 3/10 - Stop mental or physical activities that cause symptoms to worsen  greater than 3/10, and wait 24 hours before attempting them again - Eliminate screen time as much as possible for first 48 hours after concussive event, then continue limited screen time (recommend less than 2 hours per day)   - Encouraged to RTC in 1 week for reassessment or sooner for any concerns or acute changes   Pertinent previous records reviewed include CT head 06/23/2021, CT head 05/23/2021, ER note 06/23/2021, ER note 05/23/2021   Time of visit 47 minutes, which included chart review, physical exam, treatment plan, symptom severity score, VOMS, and tandem gait testing being performed, interpreted, and discussed with patient at today's visit.   Subjective:   I, [name], am serving as a Neurosurgeon for Doctor Richardean Sale  Chief Complaint: concussion symptoms   HPI:  06/26/2021 Patient is a 26 year old male complaining of concussion symptoms. Patient states Patient states first head injury occurred (05/23/2021)2 weeks ago while he was sparring.  Patient notes after the head injury he felt dazed and drowsy for a few days which completely resolved.  Patient is unsure whether or not he lost consciousness ,  returned to sparring 06/23/2021 and had numerous blows to the head.  Patient states he feels dazed and drowsy following the head injury. No LOC.  He also endorses slurred speech earlier tonight which have resolved.  Patient states his glove caught his left eye and has been having some blurry vision of the left eye.  Patient also concerned about unequal pupils.    Concussion HPI:  - Injury date: 05/23/2021  and  06/23/2021. - Mechanism of injury: sparring MMA   - LOC: no  - Initial  evaluation: ED  - Previous head injuries/concussions: yes but thinks it was COVID   - Previous imaging: yes     - Social history: full time MMA fighter    Hospitalization for head injury? No Diagnosed/treated for headache disorder or migraines? No Diagnosed with learning disability [name]?  No Diagnosed with ADD/ADHD? Yes  Diagnose with Depression, anxiety, or other Psychiatric Disorder? Yes    Current medications:  Current Outpatient Medications  Medication Sig Dispense Refill   albuterol (VENTOLIN HFA) 108 (90 Base) MCG/ACT inhaler Inhale into the lungs every 6 (six) hours as needed for wheezing or shortness of breath.     ASHWAGANDHA PO Take 600 mg by mouth.     HYDROcodone-acetaminophen (NORCO) 5-325 MG tablet 1-2 tabs po q6 hours prn pain 20 tablet 0   Magnesium Oxide (MAG-CAPS PO) Take 500 mg by mouth.     Multiple Vitamin (MULTIVITAMIN ADULT PO) Take by mouth.     Omega-3 Fatty Acids ([employer] NATURAL FISH OIL PO) Take 2,000 Units by mouth.     Potassium 99 MG TABS Take by mouth.     valACYclovir (VALTREX) 1000 MG tablet Take 1,000 mg by mouth 2 (two) times daily.     Zinc 50 MG CAPS Take by mouth.     No current facility-administered medications for this visit.      Objective:     Vitals:   06/26/21 1328  BP: 138/80  Pulse: 63  SpO2: 99%  Weight: 189 lb (85.7 kg)  Height: 5\' 8"  (1.727 m)      Body mass index is 28.74 kg/m.    Physical Exam:     General: Well-appearing, cooperative, sitting comfortably in no acute distress.  Psychiatric: Mood and affect are appropriate.     Today's Symptom Severity Score:  Scores: 0-6  Headache:4 "Pressure in head":1  Neck Pain:2  Nausea or vomiting:0  Dizziness:0  Blurred vision:2  Balance problems:3  Sensitivity to light:2  Sensitivity to noise:4  Feeling slowed down:5  Feeling like in a fog:4  Dont feel right:5  Difficulty concentrating:3  Difficulty remembering:4  Fatigue or low energy:3  Confusion:0  Drowsiness:2  More emotional:6  Irritability:6  Sadness:1  Nervous or Anxious:6  Trouble falling asleep:6   Total number of symptoms: 19/22  Symptom Severity index: 69/132  Worse with physical activity? Yes  Worse with mental activity? Yes  Percent improved since injury: 0%    Full  pain-free cervical PROM: yes    Tandem gait: - Forward, eyes open: 0 errors - Backward, eyes open: 0 errors - Forward, eyes closed: 1 errors - Backward, eyes closed: 2 errors  VOMS:   - Baseline symptoms: 0 - Horizontal Vestibular-Ocular Reflex: Dizzy 1/10  - Vertical Vestibular-Ocular Reflex: Dizzy 1/10  - Smooth pursuits: Dizzy 1/10  - Horizontal Saccades:  0/10  - Vertical Saccades: 0/10  - Visual Motion Sensitivity Test: Dizzy 2/10, headache 2/10  - Convergence: 6, 6 cm (<5 cm normal)     Electronically signed by:  4/10 D.[facility] 2:03 PM 06/26/21

## 2021-06-26 ENCOUNTER — Ambulatory Visit (INDEPENDENT_AMBULATORY_CARE_PROVIDER_SITE_OTHER): Payer: Medicaid Other | Admitting: Sports Medicine

## 2021-06-26 ENCOUNTER — Other Ambulatory Visit: Payer: Self-pay

## 2021-06-26 VITALS — BP 138/80 | HR 63 | Ht 68.0 in | Wt 189.0 lb

## 2021-06-26 DIAGNOSIS — S060X0A Concussion without loss of consciousness, initial encounter: Secondary | ICD-10-CM

## 2021-06-26 DIAGNOSIS — F411 Generalized anxiety disorder: Secondary | ICD-10-CM | POA: Diagnosis not present

## 2021-06-26 NOTE — Patient Instructions (Addendum)
Good to see you Complete mental and physical rest for 48 hours after concussive event Recommend light aerobic activity while keeping symptoms less than 3/10 Stop mental or physical activities that cause symptoms to worsen greater than 3/10, and wait 24 hours before attempting them again Eliminate screen time as much as possible for first 48 hours after concussive event, then continue limited screen time (recommend less than 2 hours per day) No sparring for the next week 1 week follow up
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
## 2021-07-03 ENCOUNTER — Other Ambulatory Visit: Payer: Self-pay

## 2021-07-03 ENCOUNTER — Emergency Department (HOSPITAL_COMMUNITY)
Admission: EM | Admit: 2021-07-03 | Discharge: 2021-07-03 | Disposition: A | Discharge status: Home / Self Care | Payer: Medicaid Other | Attending: Emergency Medicine | Admitting: Emergency Medicine

## 2021-07-03 ENCOUNTER — Encounter (HOSPITAL_COMMUNITY): Payer: Self-pay

## 2021-07-03 ENCOUNTER — Ambulatory Visit (INDEPENDENT_AMBULATORY_CARE_PROVIDER_SITE_OTHER): Payer: Medicaid Other | Admitting: Sports Medicine

## 2021-07-03 VITALS — BP 120/80 | HR 78 | Ht 68.0 in | Wt 177.0 lb

## 2021-07-03 DIAGNOSIS — F411 Generalized anxiety disorder: Secondary | ICD-10-CM

## 2021-07-03 DIAGNOSIS — G4700 Insomnia, unspecified: Secondary | ICD-10-CM | POA: Diagnosis not present

## 2021-07-03 DIAGNOSIS — S060X0D Concussion without loss of consciousness, subsequent encounter: Secondary | ICD-10-CM

## 2021-07-03 NOTE — Discharge Instructions (Signed)
Please follow-up with the concussion clinic.  If your symptoms change or worsen, you can return to the emergency department.

## 2021-07-03 NOTE — Patient Instructions (Addendum)
Good to see you  -           Complete mental and physical rest for 48 hours after concussive event -           Recommend light aerobic activity while keeping symptoms less than 3/10 -           Stop mental or physical activities that cause symptoms to worsen greater than 3/10, and wait 24 hours before attempting them again -           Eliminate screen time as much as possible for first 48 hours after concussive event, then continue limited screen time (recommend less than 2 hours per day) Start melatonin 5 mg nightly for insomnia need to buy those over the counter Referral for vestibular therapy  Establish care with behavioral  health  Recommend that you tell them about your history of of anxiety,  ptsd ,Vicodin abuse, alcohol abuse, as well as panic attacks. Can also mention that you were prescribed adderall and citrulline by a previous provider and that combo made you aggressive and that you prescribed risperidone but did not feel that it was affective  No sparring  1 week follow

## 2021-07-03 NOTE — ED Triage Notes (Signed)
Pt states that he has been unable to sleep after having a concussion 3 weeks ago. Pt states that he is having strange thoughts.

## 2021-07-03 NOTE — ED Provider Notes (Signed)
[facility]-EMERGENCY DEPT [facility] Emergency Department Provider Note MRN:  [id number]  Arrival date & time: 07/03/21     Chief Complaint   Insomnia   History of Present Illness   Brett Larsen is a 26 y.o. year-old male presents to the ED with chief complaint of insomnia.  He states that over the past few days he has not been able to sleep.  States that he sustained a concussion a few weeks ago.  States that he is being seen in the concussion clinic.  He reports that he has an appointment today, but due to his inability to sleep, he wanted to come in and be evaluated sooner.  He states that he might have PTSD, and that his anxiety is contributing to his symptoms.  He asks about being seen by psychiatry here.  History provided by patient.  Review of Systems  Pertinent review of systems noted in HPI.    Physical Exam   Vitals:   07/03/21 0425  BP: 129/88  Pulse: 90  Resp: 16  Temp: 98 F (36.7 C)  SpO2: 95%    CONSTITUTIONAL:  well-appearing, NAD NEURO:  Alert and oriented x 3, CN 3-12 grossly intact EYES:  eyes equal and reactive ENT/NECK:  Supple, no stridor  CARDIO:  appears well-perfused  PULM:  No respiratory distress,  GI/GU:  non-distended,  MSK/SPINE:  No gross deformities, no edema, moves all extremities  SKIN:  no rash, atraumatic   *Additional and/or pertinent findings included in MDM below  Diagnostic and Interventional Summary    EKG Interpretation  Date/Time:    Ventricular Rate:    PR Interval:    QRS Duration:   QT Interval:    QTC Calculation:   R Axis:     Text Interpretation:         Labs Reviewed - No data to display  No orders to display    Medications - No data to display   Procedures  /  Critical Care Procedures  ED Course and Medical Decision Making  I have reviewed the triage vital signs, the nursing notes, and pertinent available records from the EMR.   ED Course:    Patient here with insomnia.  States she has not been  sleeping well for the past several days.  Attributes this to anxiety and to recent concussion.  He has been seen in the concussion clinic, and is scheduled to be seen later today.  He also asks about being seen by psychiatry for evaluation of PTSD.  After discussing with the patient, feel the patient would be best served by following up with the concussion clinic.  He does not seem manic, nor does he seem to be a danger to himself or others.  I feel that outpatient work-up is appropriate.  Social Determinants Affecting Care:     Consultants: No consultations were needed in caring for this patient.   Treatment and Plan:  Outpatient follow-up with concussion clinic and psychiatry.  Emergency department workup does not suggest an emergent condition requiring admission or immediate intervention beyond  what has been performed at this time. The patient is safe for discharge and has  been instructed to return immediately for worsening symptoms, change in  symptoms or any other concerns    Final Clinical Impressions(s) / ED Diagnoses     ICD-10-CM   1. Insomnia, unspecified type  G47.00       ED Discharge Orders     None  Discharge Instructions Discussed with and Provided to Patient:    Discharge Instructions      Please follow-up with the concussion clinic.  If your symptoms change or worsen, you can return to the emergency department.      Roxy Horseman, PA-C 07/03/21 5364    Shon Baton, MD 07/03/21 0500
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
## 2021-07-11 ENCOUNTER — Ambulatory Visit (INDEPENDENT_AMBULATORY_CARE_PROVIDER_SITE_OTHER): Payer: Medicaid Other | Admitting: Sports Medicine

## 2021-07-11 ENCOUNTER — Other Ambulatory Visit: Payer: Self-pay

## 2021-07-11 VITALS — BP 120/78 | HR 68 | Ht 68.0 in | Wt 179.0 lb

## 2021-07-11 DIAGNOSIS — F4310 Post-traumatic stress disorder, unspecified: Secondary | ICD-10-CM

## 2021-07-11 DIAGNOSIS — F411 Generalized anxiety disorder: Secondary | ICD-10-CM | POA: Diagnosis not present

## 2021-07-11 DIAGNOSIS — S060X0D Concussion without loss of consciousness, subsequent encounter: Secondary | ICD-10-CM | POA: Diagnosis not present

## 2021-07-11 DIAGNOSIS — G4700 Insomnia, unspecified: Secondary | ICD-10-CM | POA: Diagnosis not present

## 2021-07-11 DIAGNOSIS — F419 Anxiety disorder, unspecified: Secondary | ICD-10-CM | POA: Insufficient documentation

## 2021-07-11 DIAGNOSIS — F319 Bipolar disorder, unspecified: Secondary | ICD-10-CM | POA: Diagnosis not present

## 2021-07-11 NOTE — Patient Instructions (Addendum)
Good to see you  ? Recommend light aerobic and weight lifting  activity while keeping symptoms less than 3/10 ?No contact sports or training ?2 week follow up  ?
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
## 2021-07-25 ENCOUNTER — Other Ambulatory Visit: Payer: Self-pay

## 2021-07-25 ENCOUNTER — Ambulatory Visit (INDEPENDENT_AMBULATORY_CARE_PROVIDER_SITE_OTHER): Payer: Medicaid Other | Admitting: Sports Medicine

## 2021-07-25 VITALS — BP 110/80 | HR 80 | Ht 68.0 in | Wt 182.0 lb

## 2021-07-25 DIAGNOSIS — F4310 Post-traumatic stress disorder, unspecified: Secondary | ICD-10-CM

## 2021-07-25 DIAGNOSIS — G4700 Insomnia, unspecified: Secondary | ICD-10-CM

## 2021-07-25 DIAGNOSIS — S060X0D Concussion without loss of consciousness, subsequent encounter: Secondary | ICD-10-CM | POA: Diagnosis not present

## 2021-07-25 DIAGNOSIS — F411 Generalized anxiety disorder: Secondary | ICD-10-CM

## 2021-07-25 DIAGNOSIS — F319 Bipolar disorder, unspecified: Secondary | ICD-10-CM | POA: Diagnosis not present

## 2021-07-25 NOTE — Patient Instructions (Addendum)
Good to see you  ?PT referral  ?Recommend discussing auditory hallucinations,  visual hallucinations ,difficulty sleepy ,tangential thoughts with psychiatry  ?Allowed to do very light weight lifting and very light aerobic exercise  ?NO SPARRING  ?3 week follow up  ?
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
## 2021-08-15 ENCOUNTER — Ambulatory Visit (INDEPENDENT_AMBULATORY_CARE_PROVIDER_SITE_OTHER): Payer: Medicaid Other | Admitting: Sports Medicine

## 2021-08-15 VITALS — BP 118/78 | HR 78 | Ht 68.0 in | Wt 185.0 lb

## 2021-08-15 DIAGNOSIS — F411 Generalized anxiety disorder: Secondary | ICD-10-CM

## 2021-08-15 DIAGNOSIS — F4310 Post-traumatic stress disorder, unspecified: Secondary | ICD-10-CM

## 2021-08-15 DIAGNOSIS — F319 Bipolar disorder, unspecified: Secondary | ICD-10-CM

## 2021-08-15 DIAGNOSIS — S060X0D Concussion without loss of consciousness, subsequent encounter: Secondary | ICD-10-CM

## 2021-08-15 NOTE — Patient Instructions (Addendum)
Good to see you  ?Return To Play (RTP) protocol: ? ?Step 1: Back to regular non-athletic activities  ?Return to regular activities (such as school). ? ?Step 2: Light aerobic activity/ lifting a 50% rep and weight  ?Begin with light aerobic exercise only to increase an athlete?s heart rate. This means about 5 to 10 minutes on an exercise bike, walking, or light jogging. No weight lifting at this point. ? ?Step 3: Moderate activity lifting 75 % rep and weight   ?Continue with activities to increase an athlete?s heart rate with body or head movement. This includes moderate jogging, brief running, moderate-intensity stationary biking, moderate-intensity weightlifting (less time and/or less weight from their typical routine). ? ?Step 4: Heavy, non-contact activity lifting 100%rep and weight  ?Add heavy non-contact physical activity, such as sprinting/running, high-intensity stationary biking, regular weightlifting routine, non-contact sport-specific drills (in 3 planes of movement). ? ?Step 5: Practice & full contact  ?Return to practice and full contact (if appropriate for the sport) in controlled practice. ? ?Step 6: Competition ?Cleared to return to competition. ? ?Athlete must complete all 6 steps in order.  Must wait 1 day before progressing to next up.  If symptoms return, stop activity and try that step again on the following day.  If symptoms continue, make a follow-up appointment and return to clinic. ? ?1 week follow up  ? ? ? ? ? ?
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
## 2021-08-22 ENCOUNTER — Ambulatory Visit (INDEPENDENT_AMBULATORY_CARE_PROVIDER_SITE_OTHER): Payer: Medicaid Other | Admitting: Sports Medicine

## 2021-08-22 VITALS — BP 130/78 | HR 74 | Ht 68.0 in | Wt 190.0 lb

## 2021-08-22 DIAGNOSIS — S060X0D Concussion without loss of consciousness, subsequent encounter: Secondary | ICD-10-CM

## 2021-08-22 NOTE — Patient Instructions (Signed)
Good to see you ?Fully cleared from a concussion  ?Continue to follow up with psychiatry  ?As needed follow up  ?

## 2021-09-02 ENCOUNTER — Encounter (HOSPITAL_COMMUNITY): Payer: Self-pay | Admitting: Psychiatry

## 2021-09-02 ENCOUNTER — Other Ambulatory Visit: Payer: Self-pay

## 2021-09-02 ENCOUNTER — Inpatient Hospital Stay (HOSPITAL_COMMUNITY)
Admission: RE | Admit: 2021-09-02 | Discharge: 2021-09-06 | DRG: 885 | Disposition: A | Discharge status: Home / Self Care | Payer: Medicaid Other | Attending: Psychiatry | Admitting: Psychiatry

## 2021-09-02 DIAGNOSIS — R45850 Homicidal ideations: Secondary | ICD-10-CM | POA: Diagnosis present

## 2021-09-02 DIAGNOSIS — Z87891 Personal history of nicotine dependence: Secondary | ICD-10-CM

## 2021-09-02 DIAGNOSIS — F315 Bipolar disorder, current episode depressed, severe, with psychotic features: Principal | ICD-10-CM | POA: Diagnosis present

## 2021-09-02 DIAGNOSIS — Z818 Family history of other mental and behavioral disorders: Secondary | ICD-10-CM

## 2021-09-02 DIAGNOSIS — Z79899 Other long term (current) drug therapy: Secondary | ICD-10-CM | POA: Diagnosis not present

## 2021-09-02 DIAGNOSIS — R45851 Suicidal ideations: Secondary | ICD-10-CM | POA: Diagnosis present

## 2021-09-02 DIAGNOSIS — F419 Anxiety disorder, unspecified: Secondary | ICD-10-CM

## 2021-09-02 DIAGNOSIS — K30 Functional dyspepsia: Secondary | ICD-10-CM | POA: Diagnosis present

## 2021-09-02 DIAGNOSIS — B009 Herpesviral infection, unspecified: Secondary | ICD-10-CM | POA: Diagnosis present

## 2021-09-02 DIAGNOSIS — Z9151 Personal history of suicidal behavior: Secondary | ICD-10-CM

## 2021-09-02 DIAGNOSIS — F411 Generalized anxiety disorder: Secondary | ICD-10-CM | POA: Diagnosis present

## 2021-09-02 DIAGNOSIS — F1211 Cannabis abuse, in remission: Secondary | ICD-10-CM | POA: Diagnosis present

## 2021-09-02 DIAGNOSIS — G4700 Insomnia, unspecified: Secondary | ICD-10-CM | POA: Diagnosis present

## 2021-09-02 DIAGNOSIS — F314 Bipolar disorder, current episode depressed, severe, without psychotic features: Secondary | ICD-10-CM

## 2021-09-02 DIAGNOSIS — Z87820 Personal history of traumatic brain injury: Secondary | ICD-10-CM | POA: Diagnosis not present

## 2021-09-02 DIAGNOSIS — J45909 Unspecified asthma, uncomplicated: Secondary | ICD-10-CM | POA: Diagnosis present

## 2021-09-02 DIAGNOSIS — F319 Bipolar disorder, unspecified: Secondary | ICD-10-CM | POA: Insufficient documentation

## 2021-09-02 LAB — RESP PANEL BY RT-PCR (FLU A&B, COVID) ARPGX2
Influenza A by PCR: NEGATIVE
Influenza B by PCR: NEGATIVE
SARS Coronavirus 2 by RT PCR: NEGATIVE

## 2021-09-02 MED ORDER — GABAPENTIN 100 MG PO CAPS
100.0000 mg | ORAL_CAPSULE | Freq: Three times a day (TID) | ORAL | Status: DC
  Administered 2021-09-02 – 2021-09-04 (×4): 100 mg via ORAL
  Filled 2021-09-02 (×10): qty 1

## 2021-09-02 MED ORDER — HYDROXYZINE HCL 25 MG PO TABS
25.0000 mg | ORAL_TABLET | Freq: Three times a day (TID) | ORAL | Status: DC | PRN
  Administered 2021-09-05: 25 mg via ORAL
  Filled 2021-09-02: qty 10
  Filled 2021-09-02: qty 1

## 2021-09-02 MED ORDER — ALUM & MAG HYDROXIDE-SIMETH 200-200-20 MG/5ML PO SUSP
30.0000 mL | ORAL | Status: DC | PRN
  Filled 2021-09-02: qty 30

## 2021-09-02 MED ORDER — MAGNESIUM HYDROXIDE 400 MG/5ML PO SUSP
30.0000 mL | Freq: Every day | ORAL | Status: DC | PRN
  Filled 2021-09-02: qty 30

## 2021-09-02 MED ORDER — ACETAMINOPHEN 325 MG PO TABS
650.0000 mg | ORAL_TABLET | Freq: Four times a day (QID) | ORAL | Status: DC | PRN
  Filled 2021-09-02: qty 2

## 2021-09-02 MED ORDER — TRAZODONE HCL 50 MG PO TABS
50.0000 mg | ORAL_TABLET | Freq: Every evening | ORAL | Status: DC | PRN
  Administered 2021-09-02 – 2021-09-03 (×2): 50 mg via ORAL
  Filled 2021-09-02 (×2): qty 1

## 2021-09-02 NOTE — Tx Team (Signed)
Initial Treatment Plan ?09/02/2021 ?5:55 PM ?Brett Larsen ?[id number] ? ? ? ?PATIENT STRESSORS: ?Financial difficulties   ?Marital or family conflict   ?Medication change or noncompliance   ? ? ?PATIENT STRENGTHS: ?Ability for insight  ?Motivation for treatment/growth  ?Supportive family/friends  ? ? ?PATIENT IDENTIFIED PROBLEMS: ?"Work on my anger"  ?"Handle my emotion in a healthy way"  ?Medication change  ?Depression  ?Ineffective coping skills  ?  ?  ?  ?  ?  ? ?DISCHARGE CRITERIA:  ?Adequate post-discharge living arrangements ?Motivation to continue treatment in a less acute level of care ? ?PRELIMINARY DISCHARGE PLAN: ?Attend aftercare/continuing care group ?Attend PHP/IOP ?Outpatient therapy ?Return to previous living arrangement ? ?PATIENT/FAMILY INVOLVEMENT: ?This treatment plan has been presented to and reviewed with the patient, Brett Larsen, and/or family member.  The patient and family have been given the opportunity to ask questions and make suggestions. ? ?Vela Prose, RN ?09/02/2021, 5:55 PM ?

## 2021-09-02 NOTE — Progress Notes (Signed)
Admission Note: Patient is a 26 year old male who presents as a walk-in for depression, medication change, anger and relationship issues.  Patient reports that his girlfriend broke up with him due to his anger.  Reports that his Depakote was recently switched to Lamictal and since then he has been irritable and easily agitated.  Stated he is here to learn coping skills and anger management.  Receives outpatient treatment and PHP at [facility] for his mental health.  Patient is alert and oriented x 4.  Presents with sad affect and depressed mood. Admission plan of care reviewed, consent signed for treatment.  Skin and personal belongings completed.  Skin is dry and intact.  No contraband found.  Patient oriented to the unit, staff and room.  Verbalizes understanding of unit rules/protocols.  Patient is safe on the unit. ?

## 2021-09-02 NOTE — Plan of Care (Signed)
Pt new admit, progressing. ?

## 2021-09-02 NOTE — Progress Notes (Signed)
Patient stated he needs to take valcyclvair ?For cold sores.  Stated he take one gram every morning.   ?Patient very patient and cooperative ? ? ?
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
## 2021-09-02 NOTE — [facility] Group Notes (Signed)
PT was informed but did not attend group. ?

## 2021-09-02 NOTE — Progress Notes (Signed)
?   09/02/21 2300  ?Psych Admission Type (Psych Patients Only)  ?Admission Status Voluntary  ?Psychosocial Assessment  ?Patient Complaints Worrying  ?Eye Contact Brief  ?Facial Expression Sad  ?Affect Sad  ?Speech Logical/coherent  ?Interaction Assertive  ?Motor Activity Slow  ?Appearance/Hygiene Unremarkable  ?Behavior Characteristics Cooperative  ?Mood Depressed  ?Aggressive Behavior  ?Effect No apparent injury  ?Thought Process  ?Coherency WDL  ?Content WDL  ?Delusions None reported or observed  ?Perception WDL  ?Hallucination None reported or observed  ?Judgment WDL  ?Confusion None  ?Danger to Self  ?Current suicidal ideation? Denies  ?Danger to Others  ?Danger to Others None reported or observed  ? ? ?

## 2021-09-02 NOTE — Group Note (Signed)
LCSW Group Therapy Note ? ?09/02/2021    1:00 - 2:00 PM ?             ? ?Type of Therapy and Topic:  Group Therapy: Understanding the differences between fear and anxiety / Fear Ladder & Examining the Evidence ? ?Participation Level:  Did not participate ? ? ?Description of Group:   ?In this group session, patients learned how to define and recognize the similarities differences between fear and anxiety. Patients will explore reactions we have to fear and anxiety. Patients identified a fear or something they feel anxious about. Patients analyzed scenarios and discussed both the positive and negative aspects to them. Patients were asked to identify if it is a fear or anxiety as well.  Patients were asked to provide their own examples. Patients will learn what to do for both fear and anxiety. CSW provided tools such as breaking things up into smaller steps, challenging automatic negative thinking / questions to ask, fear ladder and how to use gradual exposure. Patients will be asked to practice each tool with situations and to complete a fear ladder for their personal gain.  ? ?Therapeutic Goals: ?Patients will learn the difference between fear versus anxiety and the definitions of both. ?Patients will utilize scenarios and be able to identify if this is an example of a fear or anxiety. ?Patients will learn tools to handle both their fears and anxieties as well as discuss breaking it into smaller steps and exposure.  ?Patients will be asked to provide examples of their own and discuss how things have both positive and negative aspects.  ?Patients were provided questions to ask to challenge automatic negative thinking for anxiety.  ?Patients were provided a sample form of a fear ladder and asked to complete one for a fear.  ? ?Summary of Patient Progress:  Did not participate ? ?Therapeutic Modalities:   ?Cognitive Behavioral Therapy ?Motivational Interviewing  ?Brief Therapy ? ?Janelle Spellman E Xzavier Swinger, LCSW  ?09/02/2021 2:13  PM    ?

## 2021-09-03 DIAGNOSIS — F314 Bipolar disorder, current episode depressed, severe, without psychotic features: Secondary | ICD-10-CM

## 2021-09-03 DIAGNOSIS — G4700 Insomnia, unspecified: Secondary | ICD-10-CM

## 2021-09-03 DIAGNOSIS — F411 Generalized anxiety disorder: Secondary | ICD-10-CM

## 2021-09-03 DIAGNOSIS — B009 Herpesviral infection, unspecified: Secondary | ICD-10-CM

## 2021-09-03 DIAGNOSIS — F315 Bipolar disorder, current episode depressed, severe, with psychotic features: Principal | ICD-10-CM | POA: Diagnosis present

## 2021-09-03 LAB — VALPROIC ACID LEVEL: Valproic Acid Lvl: <10 ug/mL — ABNORMAL LOW (ref 50.0–100.0)

## 2021-09-03 LAB — HEMOGLOBIN A1C
Hgb A1c MFr Bld: 4.9 % (ref 4.8–5.6)
Mean Plasma Glucose: 93.93 mg/dL

## 2021-09-03 LAB — COMPREHENSIVE METABOLIC PANEL
ALT: 74 U/L — ABNORMAL HIGH (ref 0–44)
AST: 61 U/L — ABNORMAL HIGH (ref 15–41)
Albumin: 4.6 g/dL (ref 3.5–5.0)
Alkaline Phosphatase: 56 U/L (ref 38–126)
Anion gap: 6 (ref 5–15)
BUN: 19 mg/dL (ref 6–20)
CO2: 26 mmol/L (ref 22–32)
Calcium: 9.6 mg/dL (ref 8.9–10.3)
Chloride: 106 mmol/L (ref 98–111)
Creatinine, Ser: 0.88 mg/dL (ref 0.61–1.24)
GFR, Estimated: >60 mL/min (ref >60)
Glucose, Bld: 89 mg/dL (ref 70–99)
Potassium: 4.2 mmol/L (ref 3.5–5.1)
Sodium: 138 mmol/L (ref 135–145)
Total Bilirubin: 1.1 mg/dL (ref 0.3–1.2)
Total Protein: 7.5 g/dL (ref 6.5–8.1)

## 2021-09-03 LAB — URINALYSIS, COMPLETE (UACMP) WITH MICROSCOPIC
Bacteria, UA: NONE SEEN
Bilirubin Urine: NEGATIVE
Glucose, UA: NEGATIVE mg/dL
Hgb urine dipstick: NEGATIVE
Ketones, ur: NEGATIVE mg/dL
Leukocytes,Ua: NEGATIVE
Nitrite: NEGATIVE
Protein, ur: NEGATIVE mg/dL
Specific Gravity, Urine: 1.009 (ref 1.005–1.030)
pH: 6 (ref 5.0–8.0)

## 2021-09-03 LAB — LIPID PANEL
Cholesterol: 228 mg/dL — ABNORMAL HIGH (ref 0–200)
HDL: 65 mg/dL (ref >40)
LDL Cholesterol: 115 mg/dL — ABNORMAL HIGH (ref 0–99)
Total CHOL/HDL Ratio: 3.5 RATIO
Triglycerides: 241 mg/dL — ABNORMAL HIGH (ref <150)
VLDL: 48 mg/dL — ABNORMAL HIGH (ref 0–40)

## 2021-09-03 LAB — HEPATIC FUNCTION PANEL
ALT: 72 U/L — ABNORMAL HIGH (ref 0–44)
AST: 60 U/L — ABNORMAL HIGH (ref 15–41)
Albumin: 4.5 g/dL (ref 3.5–5.0)
Alkaline Phosphatase: 52 U/L (ref 38–126)
Bilirubin, Direct: <0.1 mg/dL (ref 0.0–0.2)
Total Bilirubin: 0.9 mg/dL (ref 0.3–1.2)
Total Protein: 7.5 g/dL (ref 6.5–8.1)

## 2021-09-03 LAB — TSH: TSH: 3.443 u[IU]/mL (ref 0.350–4.500)

## 2021-09-03 MED ORDER — DIVALPROEX SODIUM 125 MG PO DR TAB
125.0000 mg | DELAYED_RELEASE_TABLET | Freq: Once | ORAL | Status: AC
  Administered 2021-09-03: 125 mg via ORAL
  Filled 2021-09-03: qty 1

## 2021-09-03 MED ORDER — ARIPIPRAZOLE 5 MG PO TABS
5.0000 mg | ORAL_TABLET | Freq: Every day | ORAL | Status: DC
  Administered 2021-09-03 – 2021-09-05 (×3): 5 mg via ORAL
  Filled 2021-09-03 (×5): qty 1

## 2021-09-03 MED ORDER — VALACYCLOVIR HCL 500 MG PO TABS: 1000.0000 mg | ORAL_TABLET | Freq: Two times a day (BID) | ORAL | Status: DC

## 2021-09-03 MED ORDER — ARIPIPRAZOLE 5 MG PO TABS
5.0000 mg | ORAL_TABLET | Freq: Every day | ORAL | Status: DC
  Filled 2021-09-03 (×2): qty 1

## 2021-09-03 MED ORDER — LAMOTRIGINE 25 MG PO TABS
50.0000 mg | ORAL_TABLET | Freq: Every day | ORAL | Status: DC
  Administered 2021-09-03 – 2021-09-06 (×4): 50 mg via ORAL
  Filled 2021-09-03 (×5): qty 2

## 2021-09-03 MED ORDER — VALACYCLOVIR HCL 500 MG PO TABS
1000.0000 mg | ORAL_TABLET | Freq: Every day | ORAL | Status: DC
  Administered 2021-09-03 – 2021-09-06 (×4): 1000 mg via ORAL
  Filled 2021-09-03 (×5): qty 2

## 2021-09-03 NOTE — [facility] Group Notes (Signed)
Pt attended group and participated in discussion. 

## 2021-09-03 NOTE — [facility] Suicide Risk Assessment (Signed)
[facility] INPATIENT:  Family/Significant Other Suicide Prevention Education ? ?Suicide Prevention Education:  ?Education Completed; Brett Larsen [phone] (Adopted Mother) has been identified by the patient as the family member/significant other with whom the patient will be residing, and identified as the person(s) who will aid the patient in the event of a mental health crisis (suicidal ideations/suicide attempt).  With written consent from the patient, the family member/significant other has been provided the following suicide prevention education, prior to the and/or following the discharge of the patient. ? ?The suicide prevention education provided includes the following: ?Suicide risk factors ?Suicide prevention and interventions ?National Suicide Hotline telephone number ?[facility] assessment telephone number ?[facility] Emergency Assistance 911 ?[place] and/or Residential Mobile Crisis Unit telephone number ? ?Request made of family/significant other to: ?Remove weapons (e.g., guns, rifles, knives), all items previously/currently identified as safety concern.   ?Remove drugs/medications (over-the-counter, prescriptions, illicit drugs), all items previously/currently identified as a safety concern. ? ?The family member/significant other verbalizes understanding of the suicide prevention education information provided.  The family member/significant other agrees to remove the items of safety concern listed above. ? ?CSW spoke with Mrs. Fleig who states that her son recently went through a break-up with his girlfriend and has been having difficulties managing his anger.  She states that she has been taking to him about it and encouraging him to stay away from the girlfriend and their apartment so he "doesn't get into any trouble with her".  She states that her son attends all of his appointments and takes his medications correctly.  She states that one of his doctors is currently in  the process of decreasing his Gabapentin.  Mrs. Hepp states that there is 1 firearm in the home that is in a locked box but she states that she does not have a key for it and there is currently no way to gain access to it.  She states that her son can return home after discharge.  CSW completed SPE with Mrs. Guest.  ? ?Aram Beecham ?09/03/2021, 2:53 PM ?

## 2021-09-03 NOTE — Progress Notes (Signed)
?   09/03/21 0500  ?Sleep  ?Number of Hours 7  ? ? ?

## 2021-09-03 NOTE — [facility] Suicide Risk Assessment (Signed)
Suicide Risk Assessment ? ?Admission Assessment    ?[facility] Admission Suicide Risk Assessment ? ? ?Nursing information obtained from:  Patient, Review of record ?Demographic factors:  Male, Adolescent or young adult, Caucasian, Low socioeconomic status ?Current Mental Status:  Thoughts of violence towards others ?Loss Factors:  Decrease in vocational status ?Historical Factors:  NA ?Risk Reduction Factors:  Living with another person, especially a relative ? ?Total Time spent with patient: 1 hour ?Principal Problem: Severe bipolar I disorder, current or most recent episode depressed (HCC) ?Diagnosis:  Principal Problem: ?  Severe bipolar I disorder, current or most recent episode depressed (HCC) ?Active Problems: ?  GAD (generalized anxiety disorder) ?  Insomnia ?  Herpes simplex ? ?History of Present Illness: Brett Larsen is a 26 year old male with a history of bipolar disorder, PTSD, anxiety, and ADHD who walked into [facility] on 09/02/21 with complaints of worsening depressive symptoms, SI and HI towards his girlfriend.  He was deemed to be a danger to self and others, and voluntarily admitted for treatment and stabilization of his mood and depressive symptoms. ? ?Continued Clinical Symptoms: Patient reports poor sleep quality, poor concentration/focus, feelings of emptiness, anhedonia, feelings of guilt, feelings of hopelessness and helplessness, worsening anxiety and recurrent suicidal thoughts prior to admission. Pt  reports paranoia, and being suspicious of everyone, thinking that people are out to get him. Pt's current symptoms place him at risk of danger to self and others. Hospitalization continues to be necessary to treat and stabilize her mood.  ?  ?The "Alcohol Use Disorders Identification Test", Guidelines for Use in Primary Care, Second Edition.  World Science writer [facility]). ?Score between 0-7:  no or low risk or alcohol related problems. ?Score between 8-15:  moderate risk of alcohol related problems. ?Score  between 16-19:  high risk of alcohol related problems. ?Score 20 or above:  warrants further diagnostic evaluation for alcohol dependence and treatment. ? ?CLINICAL FACTORS:  ? Depression:   Anhedonia ?Hopelessness ?Impulsivity ?Insomnia ? ?Musculoskeletal: ?Strength & Muscle Tone: within normal limits ?Gait & Station: normal ?Patient leans: N/A ? ?Psychiatric Specialty Exam: ? ?Presentation  ?General Appearance: Appropriate for Environment ? ?Eye Contact:Fair ? ?Speech:Clear and Coherent ? ?Speech Volume:Normal ? ?Handedness:Right ? ?Mood and Affect  ?Mood:Euthymic ? ?Affect:Congruent ? ?Thought Process  ?Thought Processes:Coherent ? ?Descriptions of Associations:Intact ? ?Orientation:Full (Time, Place and Person) ? ?Thought Content:Logical ? ?History of Schizophrenia/Schizoaffective disorder:No data recorded ?Duration of Psychotic Symptoms:No data recorded ?Hallucinations:Hallucinations: None ? ?Ideas of Reference:Paranoia ? ?Suicidal Thoughts:Suicidal Thoughts: No ?SI Passive Intent and/or Plan: Without Plan ? ?Homicidal Thoughts:Homicidal Thoughts: No ?HI Passive Intent and/or Plan: Without Plan ? ?Sensorium  ?Memory:Immediate Good ? ?Judgment:Poor ? ?Insight:Poor ? ?Executive Functions  ?Concentration:Fair ? ?Attention Span:Fair ? ?Recall:Fair ? ?Fund of Knowledge:Fair ? ?Language:Fair ? ?Psychomotor Activity  ?Psychomotor Activity:Psychomotor Activity: Normal ? ?Assets  ?Assets:Communication Skills ? ?Sleep  ?Sleep:Sleep: Poor ?Number of Hours of Sleep: 5 ? ?Physical Exam: ?Physical Exam ?Constitutional:   ?   Appearance: Normal appearance.  ?HENT:  ?   Head: Normocephalic.  ?   Nose: Nose normal. No congestion or rhinorrhea.  ?Eyes:  ?   Pupils: Pupils are equal, round, and reactive to light.  ?Pulmonary:  ?   Effort: Pulmonary effort is normal. No respiratory distress.  ?Musculoskeletal:     ?   General: Normal range of motion.  ?   Cervical back: Normal range of motion.  ?Neurological:  ?   Mental Status:  He is alert and oriented to person,  place, and time.  ?Psychiatric:     ?   Behavior: Behavior normal.  ? ?Review of Systems  ?Constitutional: Negative.  Negative for fever.  ?HENT: Negative.    ?Eyes: Negative.   ?Respiratory: Negative.    ?Cardiovascular: Negative.   ?Gastrointestinal: Negative.  Negative for heartburn.  ?Genitourinary: Negative.   ?Musculoskeletal: Negative.   ?Skin: Negative.   ?Neurological: Negative.   ?Endo/Heme/Allergies: Negative.   ?Psychiatric/Behavioral:  Positive for depression and suicidal ideas. Negative for hallucinations and substance abuse. The patient is nervous/anxious and has insomnia.   ?Blood pressure 117/69, pulse (!) 107, temperature 98.4 ?F (36.9 ?C), temperature source Oral, resp. rate 18, height 5\' 8"  (1.727 m), weight 83.9 kg, SpO2 98 %. Body mass index is 28.13 kg/m?. ? ?COGNITIVE FEATURES THAT CONTRIBUTE TO RISK:  ?None   ? ?SUICIDE RISK:  ? Moderate:  Frequent suicidal ideation with limited intensity, and duration, some specificity in terms of plans, no associated intent, good self-control, limited dysphoria/symptomatology, some risk factors present, and identifiable protective factors, including available and accessible social support. ? ?PLAN OF CARE:  ?PLAN ?Safety and Monitoring: ?Voluntary admission to inpatient psychiatric unit for safety, stabilization and treatment ?Daily contact with patient to assess and evaluate symptoms and progress in treatment ?Patient's case to be discussed in multi-disciplinary team meeting ?Observation Level : q15 minute checks ?Vital signs: q12 hours ?Precautions: suicide, elopement, and assault ?  ?Long Term Goal(s): Improvement in symptoms so as ready for discharge ?  ?Short Term Goals: Ability to identify changes in lifestyle to reduce recurrence of condition will improve, Ability to verbalize feelings will improve, Ability to disclose and discuss suicidal ideas, Ability to demonstrate self-control will improve, Ability to  identify and develop effective coping behaviors will improve, and Compliance with prescribed medications will improve ?  ?Physician Treatment Plan for Principal & Active Diagnoses:  ?Principal Problem: ?  Severe bipolar I disorder, current or most recent episode depressed (HCC) ?Active Problems: ?  GAD (generalized anxiety disorder) ?  Insomnia ?  Herpes simplex ?  ?Medications ?-Start Abilify 5 mg nightly for mood stabilization ?-Continue Gabapentin 100 mg TID for anxiety ?-Continue Lamictal 50 mg daily for mood stabilization ?-Give one time dose of Depakote DR 125 mg (completed) ?-Continue Hydroxyzine 25 mg every 6 hours PRN ?-Continue Trazodone 50 mg nightly PRN for insomnia ?-Continue Valtrex 1000 mg daily for Herpes simplex virus ?  ?Other PRNS ?-Continue Tylenol 650 mg every 6 hours PRN for mild pain ?-Continue Maalox 30 mg every 4 hrs PRN for indigestion ?-Continue Milk of Magnesia as needed every 6 hrs for constipation ?  ?Discharge Planning: ?Social work and case management to assist with discharge planning and identification of hospital follow-up needs prior to discharge ?Estimated LOS: 5-7 days ?Discharge Concerns: Need to establish a safety plan; Medication compliance and effectiveness ?Discharge Goals: Return home with outpatient referrals for mental health follow-up including medication management/psychotherapy ?  ?I certify that inpatient services furnished can reasonably be expected to improve the patient's condition.  ? ? , NP ?09/03/2021, 6:18 PM ?
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
## 2021-09-03 NOTE — Progress Notes (Signed)
?   09/03/21 0815  ?Psych Admission Type (Psych Patients Only)  ?Admission Status Voluntary  ?Psychosocial Assessment  ?Patient Complaints Worrying;Restlessness  ?Eye Contact Brief  ?Facial Expression Sad  ?Affect Sad  ?Speech Logical/coherent  ?Interaction Assertive  ?Motor Activity Slow  ?Appearance/Hygiene Unremarkable  ?Behavior Characteristics Cooperative  ?Mood Depressed  ?Aggressive Behavior  ?Effect No apparent injury  ?Thought Process  ?Coherency WDL  ?Content WDL  ?Delusions None reported or observed  ?Perception WDL  ?Hallucination None reported or observed  ?Judgment WDL  ?Confusion None  ?Danger to Self  ?Current suicidal ideation? Denies  ?Danger to Others  ?Danger to Others None reported or observed  ? ? ?
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
## 2021-09-03 NOTE — Group Note (Signed)
Recreation Therapy Group Note ? ? ?Group Topic:Animal Assisted Therapy   ?Group Date: 09/03/2021 ?Start Time: 1430 ?End Time: 1515 ?Facilitators: Caroll Rancher, LRT,CTRS ?Location: [address] Dayroom ? ? ?Animal-Assisted Activity (AAA) Program Checklist/Progress Note ?Patient Eligibility Criteria Checklist & Daily Group note for Rec Tx Intervention ? ?AAA/T Program Assumption of Risk Form signed by Patient/ or Parent Legal Guardian YES ? ?Patient is free of allergies or severe asthma  YES ? ?Patient reports no fear of animals YES ? ?Patient reports no history of cruelty to animals YES ? ?Patient understands their participation is voluntary YES ? ?Patient washes hands before animal contact YES ? ?Patient washes hands after animal contact YES ? ? ?Group Description: Patients provided opportunity to interact with trained and credentialed [facility] dog and the community volunteer/dog handler. Patients practiced appropriate animal interaction and were educated on dog safety outside of the hospital in common community settings. Patients were allowed to use dog toys and other items to practice commands, engage the dog in play, and/or complete routine aspects of animal care.  ? ?Education: Charity fundraiser, Health visitor, Communication & Social Skills  ? ? ?Affect/Mood: Appropriate ?  ?Participation Level: Engaged ?  ? ?Clinical Observations/Individualized Feedback: Pt attended and participated in group session.  ?  ? ?Plan: Continue to engage patient in RT group sessions 2-3x/week. ? ? ?Caroll Rancher, LRT,CTRS ?09/03/2021 4:06 PM ?

## 2021-09-04 ENCOUNTER — Encounter (HOSPITAL_COMMUNITY): Payer: Self-pay

## 2021-09-04 LAB — CBC WITH DIFFERENTIAL/PLATELET
Abs Immature Granulocytes: 0.02 10*3/uL (ref 0.00–0.07)
Basophils Absolute: 0.1 10*3/uL (ref 0.0–0.1)
Basophils Relative: 1 %
Eosinophils Absolute: 0.2 10*3/uL (ref 0.0–0.5)
Eosinophils Relative: 4 %
HCT: 44.2 % (ref 39.0–52.0)
Hemoglobin: 14.5 g/dL (ref 13.0–17.0)
Immature Granulocytes: 0 %
Lymphocytes Relative: 38 %
Lymphs Abs: 2 10*3/uL (ref 0.7–4.0)
MCH: 31.8 pg (ref 26.0–34.0)
MCHC: 32.8 g/dL (ref 30.0–36.0)
MCV: 96.9 fL (ref 80.0–100.0)
Monocytes Absolute: 0.6 10*3/uL (ref 0.1–1.0)
Monocytes Relative: 11 %
Neutro Abs: 2.3 10*3/uL (ref 1.7–7.7)
Neutrophils Relative %: 46 %
Platelets: 237 10*3/uL (ref 150–400)
RBC: 4.56 MIL/uL (ref 4.22–5.81)
RDW: 12.4 % (ref 11.5–15.5)
WBC: 5.3 10*3/uL (ref 4.0–10.5)
nRBC: 0 % (ref 0.0–0.2)

## 2021-09-04 LAB — VITAMIN D 25 HYDROXY (VIT D DEFICIENCY, FRACTURES): Vit D, 25-Hydroxy: 22.95 ng/mL — ABNORMAL LOW (ref 30–100)

## 2021-09-04 MED ORDER — GABAPENTIN 100 MG PO CAPS
100.0000 mg | ORAL_CAPSULE | Freq: Two times a day (BID) | ORAL | Status: DC
  Administered 2021-09-04 – 2021-09-05 (×2): 100 mg via ORAL
  Filled 2021-09-04 (×6): qty 1

## 2021-09-04 MED ORDER — TRAZODONE HCL 50 MG PO TABS
25.0000 mg | ORAL_TABLET | Freq: Every evening | ORAL | Status: DC | PRN
  Administered 2021-09-04: 25 mg via ORAL
  Filled 2021-09-04: qty 1

## 2021-09-04 NOTE — BH IP Treatment Plan (Signed)
Interdisciplinary Treatment and Diagnostic Plan Update ? ?09/04/2021 ?Time of Session: 9:20am  ?Brett Larsen ?MRN: [id number] ? ?Principal Diagnosis: Bipolar affective disorder, depressed, severe, with psychotic behavior ([place]) ? ?Secondary Diagnoses: Principal Problem: ?  Bipolar affective disorder, depressed, severe, with psychotic behavior ([place]) ?Active Problems: ?  Insomnia ?  Herpes simplex ? ? ?Current Medications:  ?Current Facility-Administered Medications  ?Medication Dose Route Frequency Provider Last Rate Last Admin  ? acetaminophen (TYLENOL) tablet 650 mg  650 mg Oral Q6H PRN Ntuen, Kris Hartmann, FNP      ? alum & mag hydroxide-simeth (MAALOX/MYLANTA) 200-200-20 MG/5ML suspension 30 mL  30 mL Oral Q4H PRN Ntuen, Kris Hartmann, FNP      ? ARIPiprazole (ABILIFY) tablet 5 mg  5 mg Oral Daily Nelda Marseille, Amy E, MD   5 mg at 09/04/21 0932  ? gabapentin (NEURONTIN) capsule 100 mg  100 mg Oral BID Nicholes Rough, NP      ? hydrOXYzine (ATARAX) tablet 25 mg  25 mg Oral TID PRN Rozetta Nunnery, NP      ? lamoTRIgine (LAMICTAL) tablet 50 mg  50 mg Oral Daily Nkwenti, Doris, NP   50 mg at 09/04/21 0801  ? magnesium hydroxide (MILK OF MAGNESIA) suspension 30 mL  30 mL Oral Daily PRN Ntuen, Kris Hartmann, FNP      ? traZODone (DESYREL) tablet 25 mg  25 mg Oral QHS PRN Nicholes Rough, NP      ? valACYclovir (VALTREX) tablet 1,000 mg  1,000 mg Oral Daily Nelda Marseille, Amy E, MD   1,000 mg at 09/04/21 0802  ? ?PTA Medications: ?Medications Prior to Admission  ?Medication Sig Dispense Refill Last Dose  ? albuterol (VENTOLIN HFA) 108 (90 Base) MCG/ACT inhaler Inhale into the lungs every 6 (six) hours as needed for wheezing or shortness of breath.   Past Week  ? Cholecalciferol 50 MCG (2000 UT) CAPS Take 1 capsule by mouth daily.   Past Week  ? divalproex (DEPAKOTE) 125 MG DR tablet Take 1 tablet by mouth daily.   Past Week  ? gabapentin (NEURONTIN) 100 MG capsule Take 100 mg by mouth 2 (two) times daily.   Past Week  ? lamoTRIgine (LAMICTAL) 25 MG  tablet Take 2 tablets by mouth daily.   Past Week  ? Magnesium Oxide (MAG-CAPS PO) Take 500 mg by mouth.   Past Week  ? Multiple Vitamin (MULTIVITAMIN ADULT PO) Take by mouth.   Past Week  ? Omega-3 Fatty Acids ([employer] NATURAL FISH OIL PO) Take 2,000 Units by mouth.   Past Week  ? Potassium 99 MG TABS Take by mouth.   Past Week  ? valACYclovir (VALTREX) 1000 MG tablet Take 1,000 mg by mouth 2 (two) times daily.   Past Week  ? ? ?Patient Stressors: Financial difficulties   ?Marital or family conflict   ?Medication change or noncompliance   ? ?Patient Strengths: Ability for insight  ?Motivation for treatment/growth  ?Supportive family/friends  ? ?Treatment Modalities: Medication Management, Group therapy, Case management,  ?1 to 1 session with clinician, Psychoeducation, Recreational therapy. ? ? ?Physician Treatment Plan for Primary Diagnosis: Bipolar affective disorder, depressed, severe, with psychotic behavior ([place]) ?Long Term Goal(s): Improvement in symptoms so as ready for discharge  ? ?Short Term Goals: Ability to identify changes in lifestyle to reduce recurrence of condition will improve ?Ability to verbalize feelings will improve ?Ability to disclose and discuss suicidal ideas ?Ability to demonstrate self-control will improve ?Ability to identify and develop effective coping behaviors will improve ?  Compliance with prescribed medications will improve ? ?Medication Management: Evaluate patient's response, side effects, and tolerance of medication regimen. ? ?Therapeutic Interventions: 1 to 1 sessions, Unit Group sessions and Medication administration. ? ?Evaluation of Outcomes: Not Met ? ?Physician Treatment Plan for Secondary Diagnosis: Principal Problem: ?  Bipolar affective disorder, depressed, severe, with psychotic behavior ([place]) ?Active Problems: ?  Insomnia ?  Herpes simplex ? ?Long Term Goal(s): Improvement in symptoms so as ready for discharge  ? ?Short Term Goals: Ability to identify changes in  lifestyle to reduce recurrence of condition will improve ?Ability to verbalize feelings will improve ?Ability to disclose and discuss suicidal ideas ?Ability to demonstrate self-control will improve ?Ability to identify and develop effective coping behaviors will improve ?Compliance with prescribed medications will improve    ? ?Medication Management: Evaluate patient's response, side effects, and tolerance of medication regimen. ? ?Therapeutic Interventions: 1 to 1 sessions, Unit Group sessions and Medication administration. ? ?Evaluation of Outcomes: Not Met ? ? ?RN Treatment Plan for Primary Diagnosis: Bipolar affective disorder, depressed, severe, with psychotic behavior ([place]) ?Long Term Goal(s): Knowledge of disease and therapeutic regimen to maintain health will improve ? ?Short Term Goals: Ability to remain free from injury will improve, Ability to participate in decision making will improve, Ability to verbalize feelings will improve, Ability to disclose and discuss suicidal ideas, and Ability to identify and develop effective coping behaviors will improve ? ?Medication Management: RN will administer medications as ordered by provider, will assess and evaluate patient's response and provide education to patient for prescribed medication. RN will report any adverse and/or side effects to prescribing provider. ? ?Therapeutic Interventions: 1 on 1 counseling sessions, Psychoeducation, Medication administration, Evaluate responses to treatment, Monitor vital signs and CBGs as ordered, Perform/monitor CIWA, COWS, AIMS and Fall Risk screenings as ordered, Perform wound care treatments as ordered. ? ?Evaluation of Outcomes: Not Met ? ? ?LCSW Treatment Plan for Primary Diagnosis: Bipolar affective disorder, depressed, severe, with psychotic behavior ([place]) ?Long Term Goal(s): Safe transition to appropriate next level of care at discharge, Engage patient in therapeutic group addressing interpersonal concerns. ? ?Short  Term Goals: Engage patient in aftercare planning with referrals and resources, Increase social support, Increase emotional regulation, Facilitate acceptance of mental health diagnosis and concerns, Identify triggers associated with mental health/substance abuse issues, and Increase skills for wellness and recovery ? ?Therapeutic Interventions: Assess for all discharge needs, 1 to 1 time with Education officer, museum, Explore available resources and support systems, Assess for adequacy in community support network, Educate family and significant other(s) on suicide prevention, Complete Psychosocial Assessment, Interpersonal group therapy. ? ?Evaluation of Outcomes: Not Met ? ? ?Progress in Treatment: ?Attending groups: Yes. ?Participating in groups: Yes. ?Taking medication as prescribed: Yes. ?Toleration medication: Yes. ?Family/Significant other contact made: Yes, individual(s) contacted:  Mother  ?Patient understands diagnosis: Yes. ?Discussing patient identified problems/goals with staff: Yes. ?Medical problems stabilized or resolved: Yes. ?Denies suicidal/homicidal ideation: Yes. ?Issues/concerns per patient self-inventory: No. ? ? ?New problem(s) identified: No, Describe:  None  ? ?New Short Term/Long Term Goal(s): medication stabilization, elimination of SI thoughts, development of comprehensive mental wellness plan.  ? ?Patient Goals: Did not attend  ? ?Discharge Plan or Barriers: Patient recently admitted. CSW will continue to follow and assess for appropriate referrals and possible discharge planning.  ? ?Reason for Continuation of Hospitalization: Anxiety ?Depression ?Medication stabilization ?Suicidal ideation ? ?Estimated Length of Stay: 3 to 5 days  ? ?Last 3 [place] Suicide Severity Risk Score: ?[place] Admission (  Current) from OP Visit from 09/02/2021 in [facility] ED from 07/03/2021 in [facility] ED from 06/23/2021 in [facility]  ?C-SSRS RISK CATEGORY No Risk No Risk No Risk  ? ?  ? ? ?Last PHQ 2/9 Scores: ?   ? View : No data to display.  ?  ?  ?  ? ? ?Scribe for Treatment Team: ?Darleen Crocker, [place] ?4/26/

## 2021-09-04 NOTE — Group Note (Signed)
Recreation Therapy Group Note ? ? ?Group Topic:Stress Management  ?Group Date: 09/04/2021 ?Start Time: 64 ?End Time: 0955 ?Facilitators: Caroll Rancher, LRT,CTRS ?Location: [address] Dayroom ? ? ?Goal Area(s) Addresses:  ?Patient will identify positive stress management techniques. ?Patient will identify benefits of using stress management post d/c. ? ?Group Description:  Meditation.  LRT played a meditation that focused on setting intention for the day.  It also focused on making the best use for your time and not focusing on things from the previous day but to focus on what you can in the now.  Patients were to listen and follow along as meditation played to fully engage.  Patients also learned they could find other stress management techniques from Youtube, apps and the Internet in general. ? ? ?Affect/Mood: Appropriate ?  ?Participation Level: Engaged ?  ?Participation Quality: Independent ?  ?Behavior: Appropriate ?  ?Speech/Thought Process: Focused ?  ?Insight: Good ?  ?Judgement: Good ?  ?Modes of Intervention: Meditation ?  ?Patient Response to Interventions:  Engaged ?  ?Education Outcome: ? Acknowledges education and In group clarification offered   ? ?Clinical Observations/Individualized Feedback: Pt listened and engaged as meditation played during group session.  ? ? ?Plan: Continue to engage patient in RT group sessions 2-3x/week. ? ? ?Caroll Rancher, LRT,CTRS ?09/04/2021 12:05 PM ?

## 2021-09-04 NOTE — Progress Notes (Signed)
D:  Patient's self inventory sheet, patient has fair sleep, sleep medication helpful.  Good appetite, normal energy level, good concentration.  Denied depression and hopeless. Rated anxiety 4.  Denied withdrawals.  Then checked sedation, cravings, cramping.   Worst pain in past 24 hours is #3, neck.  Goal is acceptance.  Plans to be open, honest.  No discharge plans. ?A:  Medications administered per MD orders.  Emotional support and encouragement given patient. ?R:  Denied SI and HI, contracts for safety.  Denied A/V hallucinations.  Safety maintained with 15 minute checks. ? ?

## 2021-09-04 NOTE — BH IP Treatment Plan (Addendum)
Interdisciplinary Treatment and Diagnostic Plan Update ? ?09/04/2021 ?Time of Session: 9:20am  ?Brett Larsen ?MRN: [id number] ? ?Principal Diagnosis: Bipolar affective disorder, depressed, severe, with psychotic behavior ([place]) ? ?Secondary Diagnoses: Principal Problem: ?  Bipolar affective disorder, depressed, severe, with psychotic behavior ([place]) ?Active Problems: ?  Insomnia ?  Herpes simplex ? ? ?Current Medications:  ?Current Facility-Administered Medications  ?Medication Dose Route Frequency Provider Last Rate Last Admin  ? acetaminophen (TYLENOL) tablet 650 mg  650 mg Oral Q6H PRN Ntuen, Kris Hartmann, FNP      ? alum & mag hydroxide-simeth (MAALOX/MYLANTA) 200-200-20 MG/5ML suspension 30 mL  30 mL Oral Q4H PRN Ntuen, Kris Hartmann, FNP      ? ARIPiprazole (ABILIFY) tablet 5 mg  5 mg Oral Daily Nelda Marseille, Amy E, MD   5 mg at 09/04/21 0932  ? gabapentin (NEURONTIN) capsule 100 mg  100 mg Oral BID Nicholes Rough, NP      ? hydrOXYzine (ATARAX) tablet 25 mg  25 mg Oral TID PRN Rozetta Nunnery, NP      ? lamoTRIgine (LAMICTAL) tablet 50 mg  50 mg Oral Daily Nkwenti, Doris, NP   50 mg at 09/04/21 0801  ? magnesium hydroxide (MILK OF MAGNESIA) suspension 30 mL  30 mL Oral Daily PRN Ntuen, Kris Hartmann, FNP      ? traZODone (DESYREL) tablet 25 mg  25 mg Oral QHS PRN Nicholes Rough, NP      ? valACYclovir (VALTREX) tablet 1,000 mg  1,000 mg Oral Daily Nelda Marseille, Amy E, MD   1,000 mg at 09/04/21 0802  ? ?PTA Medications: ?Medications Prior to Admission  ?Medication Sig Dispense Refill Last Dose  ? albuterol (VENTOLIN HFA) 108 (90 Base) MCG/ACT inhaler Inhale into the lungs every 6 (six) hours as needed for wheezing or shortness of breath.   Past Week  ? Cholecalciferol 50 MCG (2000 UT) CAPS Take 1 capsule by mouth daily.   Past Week  ? divalproex (DEPAKOTE) 125 MG DR tablet Take 1 tablet by mouth daily.   Past Week  ? gabapentin (NEURONTIN) 100 MG capsule Take 100 mg by mouth 2 (two) times daily.   Past Week  ? lamoTRIgine (LAMICTAL) 25 MG  tablet Take 2 tablets by mouth daily.   Past Week  ? Magnesium Oxide (MAG-CAPS PO) Take 500 mg by mouth.   Past Week  ? Multiple Vitamin (MULTIVITAMIN ADULT PO) Take by mouth.   Past Week  ? Omega-3 Fatty Acids ([employer] NATURAL FISH OIL PO) Take 2,000 Units by mouth.   Past Week  ? Potassium 99 MG TABS Take by mouth.   Past Week  ? valACYclovir (VALTREX) 1000 MG tablet Take 1,000 mg by mouth 2 (two) times daily.   Past Week  ? ? ?Patient Stressors: Financial difficulties   ?Marital or family conflict   ?Medication change or noncompliance   ? ?Patient Strengths: Ability for insight  ?Motivation for treatment/growth  ?Supportive family/friends  ? ?Treatment Modalities: Medication Management, Group therapy, Case management,  ?1 to 1 session with clinician, Psychoeducation, Recreational therapy. ? ? ?Physician Treatment Plan for Primary Diagnosis: Bipolar affective disorder, depressed, severe, with psychotic behavior ([place]) ?Long Term Goal(s): Improvement in symptoms so as ready for discharge  ? ?Short Term Goals: Ability to identify changes in lifestyle to reduce recurrence of condition will improve ?Ability to verbalize feelings will improve ?Ability to disclose and discuss suicidal ideas ?Ability to demonstrate self-control will improve ?Ability to identify and develop effective coping behaviors will improve ?  Compliance with prescribed medications will improve ? ?Medication Management: Evaluate patient's response, side effects, and tolerance of medication regimen. ? ?Therapeutic Interventions: 1 to 1 sessions, Unit Group sessions and Medication administration. ? ?Evaluation of Outcomes: Not Met ? ?Physician Treatment Plan for Secondary Diagnosis: Principal Problem: ?  Bipolar affective disorder, depressed, severe, with psychotic behavior ([place]) ?Active Problems: ?  Insomnia ?  Herpes simplex ? ?Long Term Goal(s): Improvement in symptoms so as ready for discharge  ? ?Short Term Goals: Ability to identify changes in  lifestyle to reduce recurrence of condition will improve ?Ability to verbalize feelings will improve ?Ability to disclose and discuss suicidal ideas ?Ability to demonstrate self-control will improve ?Ability to identify and develop effective coping behaviors will improve ?Compliance with prescribed medications will improve    ? ?Medication Management: Evaluate patient's response, side effects, and tolerance of medication regimen. ? ?Therapeutic Interventions: 1 to 1 sessions, Unit Group sessions and Medication administration. ? ?Evaluation of Outcomes: Not Met ? ? ?RN Treatment Plan for Primary Diagnosis: Bipolar affective disorder, depressed, severe, with psychotic behavior ([place]) ?Long Term Goal(s): Knowledge of disease and therapeutic regimen to maintain health will improve ? ?Short Term Goals: Ability to remain free from injury will improve, Ability to participate in decision making will improve, Ability to verbalize feelings will improve, Ability to disclose and discuss suicidal ideas, and Ability to identify and develop effective coping behaviors will improve ? ?Medication Management: RN will administer medications as ordered by provider, will assess and evaluate patient's response and provide education to patient for prescribed medication. RN will report any adverse and/or side effects to prescribing provider. ? ?Therapeutic Interventions: 1 on 1 counseling sessions, Psychoeducation, Medication administration, Evaluate responses to treatment, Monitor vital signs and CBGs as ordered, Perform/monitor CIWA, COWS, AIMS and Fall Risk screenings as ordered, Perform wound care treatments as ordered. ? ?Evaluation of Outcomes: Not Met ? ? ?LCSW Treatment Plan for Primary Diagnosis: Bipolar affective disorder, depressed, severe, with psychotic behavior ([place]) ?Long Term Goal(s): Safe transition to appropriate next level of care at discharge, Engage patient in therapeutic group addressing interpersonal concerns. ? ?Short  Term Goals: Engage patient in aftercare planning with referrals and resources, Increase social support, Increase emotional regulation, Facilitate acceptance of mental health diagnosis and concerns, Identify triggers associated with mental health/substance abuse issues, and Increase skills for wellness and recovery ? ?Therapeutic Interventions: Assess for all discharge needs, 1 to 1 time with Education officer, museum, Explore available resources and support systems, Assess for adequacy in community support network, Educate family and significant other(s) on suicide prevention, Complete Psychosocial Assessment, Interpersonal group therapy. ? ?Evaluation of Outcomes: Not Met ? ? ?Progress in Treatment: ?Attending groups: Yes. ?Participating in groups: Yes. ?Taking medication as prescribed: Yes. ?Toleration medication: Yes. ?Family/Significant other contact made: Yes, individual(s) contacted:  Mother  ?Patient understands diagnosis: Yes. ?Discussing patient identified problems/goals with staff: Yes. ?Medical problems stabilized or resolved: Yes. ?Denies suicidal/homicidal ideation: Yes. ?Issues/concerns per patient self-inventory: No. ? ? ?New problem(s) identified: No, Describe:  None  ? ?New Short Term/Long Term Goal(s): medication stabilization, elimination of SI thoughts, development of comprehensive mental wellness plan.  ? ?Patient Goals: Did not attend  ? ?Discharge Plan or Barriers: Patient recently admitted. CSW will continue to follow and assess for appropriate referrals and possible discharge planning.  ? ?Reason for Continuation of Hospitalization: Aggression ?Anxiety ?Depression ?Medication stabilization ?Suicidal ideation ? ?Estimated Length of Stay: 3 to 5 days  ? ?Last 3 [place] Suicide Severity Risk Score: ?Flowsheet Row  Admission (Current) from OP Visit from 09/02/2021 in [facility] ED from 07/03/2021 in [facility] ED from 06/23/2021 in [facility]  ?C-SSRS RISK CATEGORY No Risk No Risk No Risk  ? ?  ? ? ?Last PHQ 2/9 Scores: ?   ? View : No data to display.  ?  ?  ?  ? ? ?Scribe for Treatment Team: ?Darleen Crocker,

## 2021-09-04 NOTE — Progress Notes (Signed)
?   09/03/21 2055  ?Psych Admission Type (Psych Patients Only)  ?Admission Status Voluntary  ?Psychosocial Assessment  ?Patient Complaints Worrying  ?Eye Contact Brief  ?Facial Expression Sad;Worried  ?Affect Sad  ?Speech Logical/coherent  ?Interaction Assertive  ?Motor Activity Slow  ?Appearance/Hygiene Unremarkable  ?Behavior Characteristics Appropriate to situation  ?Mood Sad  ?Thought Process  ?Coherency WDL  ?Content WDL  ?Delusions None reported or observed  ?Perception WDL  ?Hallucination None reported or observed  ?Judgment WDL  ?Confusion None  ?Danger to Self  ?Current suicidal ideation? Denies  ?Danger to Others  ?Danger to Others None reported or observed  ? ? ?

## 2021-09-04 NOTE — [facility] Group Notes (Signed)
Patient attended and participated in the NA group. ?

## 2021-09-04 NOTE — Plan of Care (Signed)
Nurse discussed coping skills, anxiety and depression with  patient.  

## 2021-09-04 NOTE — [facility] Group Notes (Signed)
Adult Psychoeducational Group Note ? ?Date:  09/04/2021 ?Time:  3:04 PM ? ?Group Topic/Focus:  ?Wellness Toolbox:   The focus of this group is to discuss various aspects of wellness, balancing those aspects and exploring ways to increase the ability to experience wellness.  Patients will create a wellness toolbox for use upon discharge. ? ?Participation Level:  Active ? ?Participation Quality:  Attentive ? ?Affect:  Appropriate ? ?Cognitive:  Alert ? ?Insight: Appropriate ? ?Engagement in Group:  Engaged ? ?Modes of Intervention:  Activity ? ?Additional Comments:  Patient attended and participated in the relaxation group activity. ? ?Jearl Klinefelter ?09/04/2021, 3:04 PM ?

## 2021-09-04 NOTE — Progress Notes (Signed)
[name] MD Progress Note ? ?09/04/2021 2:28 PM ?Brett Larsen  ?MRN:  [id number] ? ?Subjective:  [name] states: "I am doing well. I feel like the Abilify is helping me alot. I feel tired this morning though." ? ?Reason for Admission:  Brett Larsen is a 26 year old male with a history of bipolar disorder, PTSD, anxiety, and ADHD who walked into [facility] on 09/02/21 with complaints of worsening depressive symptoms, SI and HI towards his girlfriend.  He was deemed to be a danger to self and others, and voluntarily admitted for treatment and stabilization of his mood and depressive symptoms. ? ?Today's assessment: Pt's chart is reviewed, his case discussed with the treatment team. Pt presents with a euthymic mood & affect is congruent and appropriate. attention to personal hygiene and grooming is fair, eye contact is good, speech is clear & coherent. Thought contents are organized and logical, and pt currently denies SI/HI/AVH or paranoia. There is no evidence of delusional thoughts.   ? ?Pt reports an overall improvement today, but reports being tired, and stating that some of the medications might be contributing to the tiredness. Pt has been educated that when medications are first started, they might lead to feelings of tiredness, and body should readjust as medications he continues taking the medications. Pt prefers to take the Abilify nightly at 8 pm, and prefers for the dose of the Trazodone to be reduced to 25 mg nightly PRN. Pt also refers to take the Gabapentin BID at 0800 & 1500.  Will make these adjustments and continue other medications as listed below. Pt reports feeling less anxious today, reports sleeping 7.5 hours last night, but states that this was not enough sleep for him. As per nursing flow sheets, he slept for 7 hours. Pt reports a good appetite.  ? ?Principal Problem: Bipolar affective disorder, depressed, severe, with psychotic behavior ([facility]) ?Diagnosis: Principal Problem: ?  Bipolar affective disorder,  depressed, severe, with psychotic behavior ([facility]) ?Active Problems: ?  Insomnia ?  Herpes simplex ? ?Total Time spent with patient: 20 minutes ? ?Past Psychiatric History: As above ? ?Past Medical History:  ?Past Medical History:  ?Diagnosis Date  ? ADD (attention deficit disorder)   ? Asthma   ? Chronic anxiety   ? Mixed hyperglyceridemia   ? Psychogenic nonepileptic seizure   ?  ?Past Surgical History:  ?Procedure Laterality Date  ? CLOSED REDUCTION METACARPAL WITH PERCUTANEOUS PINNING Right 02/19/2021  ? Procedure: CLOSED REDUCTION METACARPAL WITH PERCUTANEOUS PINNING RIGHT RING AND SMALL METACARPAL BASE FRACTURE DISLOCATION;  Surgeon: Betha Loa, MD;  Location: [facility];  Service: Orthopedics;  Laterality: Right;  ? WISDOM TOOTH EXTRACTION    ? ?Family History: History reviewed. No pertinent family history. ?Family Psychiatric  History: bipolar d/o in mother ?Social History:  ?Social History  ? ?Substance and Sexual Activity  ?Alcohol Use Not Currently  ?   ?Social History  ? ?Substance and Sexual Activity  ?Drug Use Yes  ? Types: Other-see comments  ? Comment: edible THC Dela 8 (last used evening of 02/17/21)  ?  ?Social History  ? ?Socioeconomic History  ? Marital status: Single  ?  Spouse name: Not on file  ? Number of children: Not on file  ? Years of education: Not on file  ? Highest education level: Not on file  ?Occupational History  ? Not on file  ?Tobacco Use  ? Smoking status: Former  ? Smokeless tobacco: Never  ?Vaping Use  ? Vaping Use: Not on file  ?  Substance and Sexual Activity  ? Alcohol use: Not Currently  ? Drug use: Yes  ?  Types: Other-see comments  ?  Comment: edible THC Dela 8 (last used evening of 02/17/21)  ? Sexual activity: Not on file  ?Other Topics Concern  ? Not on file  ?Social History Narrative  ? Not on file  ? ?Social Determinants of Health  ? ?Financial Resource Strain: Not on file  ?Food Insecurity: Not on file  ?Transportation Needs: Not on file  ?Physical  Activity: Not on file  ?Stress: Not on file  ?Social Connections: Not on file  ? ?Additional Social History:  ?  ? Sleep: Fair ? ?Appetite:  Good ? ?Current Medications: ?Current Facility-Administered Medications  ?Medication Dose Route Frequency Provider Last Rate Last Admin  ? acetaminophen (TYLENOL) tablet 650 mg  650 mg Oral Q6H PRN Ntuen, Jesusita Okaina C, FNP      ? alum & mag hydroxide-simeth (MAALOX/MYLANTA) 200-200-20 MG/5ML suspension 30 mL  30 mL Oral Q4H PRN Ntuen, Jesusita Okaina C, FNP      ? ARIPiprazole (ABILIFY) tablet 5 mg  5 mg Oral Daily Mason JimSingleton, Amy E, MD   5 mg at 09/04/21 [id number]  ? gabapentin (NEURONTIN) capsule 100 mg  100 mg Oral BID Starleen BlueNkwenti, Chaunce Winkels, NP      ? hydrOXYzine (ATARAX) tablet 25 mg  25 mg Oral TID PRN Jackelyn PolingBerry, Jason A, NP      ? lamoTRIgine (LAMICTAL) tablet 50 mg  50 mg Oral Daily Jarel Cuadra, NP   50 mg at 09/04/21 0801  ? magnesium hydroxide (MILK OF MAGNESIA) suspension 30 mL  30 mL Oral Daily PRN Ntuen, Jesusita Okaina C, FNP      ? traZODone (DESYREL) tablet 25 mg  25 mg Oral QHS PRN Starleen BlueNkwenti, Luisenrique Conran, NP      ? valACYclovir (VALTREX) tablet 1,000 mg  1,000 mg Oral Daily Mason JimSingleton, Amy E, MD   1,000 mg at 09/04/21 0802  ? ? ?Lab Results:  ?Results for orders placed or performed during the hospital encounter of 09/02/21 (from the past 48 hour(s))  ?Comprehensive metabolic panel     Status: Abnormal  ? Collection Time: 09/03/21  6:35 AM  ?Result Value Ref Range  ? Sodium 138 135 - 145 mmol/L  ? Potassium 4.2 3.5 - 5.1 mmol/L  ? Chloride 106 98 - 111 mmol/L  ? CO2 26 22 - 32 mmol/L  ? Glucose, Bld 89 70 - 99 mg/dL  ?  Comment: Glucose reference range applies only to samples taken after fasting for at least 8 hours.  ? BUN 19 6 - 20 mg/dL  ? Creatinine, Ser 0.88 0.61 - 1.24 mg/dL  ? Calcium 9.6 8.9 - 10.3 mg/dL  ? Total Protein 7.5 6.5 - 8.1 g/dL  ? Albumin 4.6 3.5 - 5.0 g/dL  ? AST 61 (H) 15 - 41 U/L  ? ALT 74 (H) 0 - 44 U/L  ? Alkaline Phosphatase 56 38 - 126 U/L  ? Total Bilirubin 1.1 0.3 - 1.2 mg/dL  ? GFR,  Estimated >60 >60 mL/min  ?  Comment: (NOTE) ?Calculated using the CKD-EPI Creatinine Equation (2021) ?  ? Anion gap 6 5 - 15  ?  Comment: Performed at [facility], [address] [place], [place] [address]  ?Hemoglobin A1c     Status: None  ? Collection Time: 09/03/21  6:35 AM  ?Result Value Ref Range  ? Hgb A1c MFr Bld 4.9 4.8 - 5.6 %  ?  Comment: (  NOTE) ?Pre diabetes:          5.7%-6.4% ? ?Diabetes:              >6.4% ? ?Glycemic control for   <7.0% ?adults with diabetes ?  ? Mean Plasma Glucose 93.93 mg/dL  ?  Comment: Performed at [facility], [address], [place], [place] [address]  ?Lipid panel     Status: Abnormal  ? Collection Time: 09/03/21  6:35 AM  ?Result Value Ref Range  ? Cholesterol 228 (H) 0 - 200 mg/dL  ? Triglycerides 241 (H) <150 mg/dL  ? HDL 65 >40 mg/dL  ? Total CHOL/HDL Ratio 3.5 RATIO  ? VLDL 48 (H) 0 - 40 mg/dL  ? LDL Cholesterol 115 (H) 0 - 99 mg/dL  ?  Comment:        ?Total Cholesterol/HDL:CHD Risk ?Coronary Heart Disease Risk Table ?                    Men   Women ? 1/2 Average Risk   3.4   3.3 ? Average Risk       5.0   4.4 ? 2 X Average Risk   9.6   7.1 ? 3 X Average Risk  23.4   11.0 ?       ?Use the calculated Patient Ratio ?above and the CHD Risk Table ?to determine the patient's CHD Risk. ?       ?ATP III CLASSIFICATION (LDL): ? <100     mg/dL   Optimal ? 330-076  mg/dL   Near or Above ?                   Optimal ? 130-159  mg/dL   Borderline ? 160-189  mg/dL   High ? >226     mg/dL   Very High ?Performed at [facility], [address] [place], [place] [address] ?  ?TSH     Status: None  ? Collection Time: 09/03/21  6:35 AM  ?Result Value Ref Range  ? TSH 3.443 0.350 - 4.500 uIU/mL  ?  Comment: Performed by a 3rd Generation assay with a functional sensitivity of <=0.01 uIU/mL. ?Performed at [facility], [address], [place], [place] [address] ?  ?Hepatic function panel     Status: Abnormal  ? Collection  Time: 09/03/21  6:35 AM  ?Result Value Ref Range  ? Total Protein 7.5 6.5 - 8.1 g/dL  ? Albumin 4.5 3.5 - 5.0 g/dL  ? AST 60 (H) 15 - 41 U/L  ? ALT 72 (H) 0 - 44 U/L  ? Alkaline Phosphatase 52 38 - 126 U/L  ? Total Bi

## 2021-09-04 NOTE — Group Note (Signed)
[facility] LCSW Group Therapy Note ? ?Date/Time: 09/04/2021 @ 1pm ? ?Type of Therapy and Topic:  Group Therapy:  Strengths and Qualities ? ?Participation Level:  Active ? ?Description of Group:   ? In this group patients will be asked to explore and define the terms strength ans qualities.  Patients will be guided to discuss their thoughts, feelings, and behaviors as to where strengths and qualities originate. Participants will then list some of their strengths and qualities related to each subject topic. This group will be process-oriented, with patients participating in exploration of their own experiences as well as giving and receiving support and challenge from other group members. ? ?Therapeutic Goals: ?Patient will identify specific strengths related to their personal life. ?Patient will identify feelings, thoughts, and beliefs about strengths and qualities. ?Patient will identify ways their strengths have been used. Marland Kitchen ?Patient will identify situations where they have helped others or made someone else happy. . ? ?Summary of Patient Progress ?Patient participated in group on today. Patient was appropriate, attentive, and sharing in his experiences. Patient was able to discuss some of his feelings and behaviors related to the obstacles his has faced. Patient also shared things he feels he is good at and what he loves about his appearance. Patient interacted positively with staff and peers, and was receptive to feedback provided by staff. ? ? ? ? ?Therapeutic Modalities:   ?Cognitive Behavioral Therapy ?Solution Focused Therapy ?Motivational Interviewing ?Brief Therapy ? ? ?Vergene Marland, LCSW, LCAS ?Clincal Social Worker  ?[facility] ? ? ?

## 2021-09-05 LAB — HEPATITIS PANEL, ACUTE
HCV Ab: NONREACTIVE
Hep A IgM: NONREACTIVE
Hep B C IgM: NONREACTIVE
Hepatitis B Surface Ag: NONREACTIVE

## 2021-09-05 MED ORDER — VITAMIN D (ERGOCALCIFEROL) 1.25 MG (50000 UNIT) PO CAPS
50000.0000 [IU] | ORAL_CAPSULE | ORAL | Status: DC
  Administered 2021-09-05: 50000 [IU] via ORAL
  Filled 2021-09-05 (×2): qty 1

## 2021-09-05 MED ORDER — GABAPENTIN 100 MG PO CAPS
100.0000 mg | ORAL_CAPSULE | Freq: Three times a day (TID) | ORAL | Status: DC
  Administered 2021-09-05 – 2021-09-06 (×3): 100 mg via ORAL
  Filled 2021-09-05 (×6): qty 1

## 2021-09-05 MED ORDER — TRAZODONE HCL 50 MG PO TABS
50.0000 mg | ORAL_TABLET | Freq: Every evening | ORAL | Status: DC | PRN
  Administered 2021-09-05: 50 mg via ORAL
  Filled 2021-09-05: qty 7
  Filled 2021-09-05: qty 1

## 2021-09-05 MED ORDER — ARIPIPRAZOLE 10 MG PO TABS
10.0000 mg | ORAL_TABLET | Freq: Every day | ORAL | Status: DC
  Administered 2021-09-06: 10 mg via ORAL
  Filled 2021-09-05 (×3): qty 1

## 2021-09-05 MED ORDER — ARIPIPRAZOLE 5 MG PO TABS
5.0000 mg | ORAL_TABLET | Freq: Once | ORAL | Status: AC
  Administered 2021-09-05: 5 mg via ORAL
  Filled 2021-09-05: qty 1

## 2021-09-05 NOTE — [facility] Group Notes (Signed)
Adult Psychoeducational Group Note ? ?Date:  09/05/2021 ?Time:  3:26 PM ? ?Group Topic/Focus:  ?Goals Group:   The focus of this group is to help patients establish daily goals to achieve during treatment and discuss how the patient can incorporate goal setting into their daily lives to aide in recovery. ? ?Participation Level:  Active ? ?Participation Quality:  Appropriate ? ?Affect:  Appropriate ? ?Cognitive:  Appropriate ? ?Insight: Appropriate ? ?Engagement in Group:  Engaged ? ?Modes of Intervention:  Discussion ? ?Additional Comments:  Patient attended morning orientation group and participated.  ? ?Ndrew Creason W Lorenda Peck ?09/05/2021, 3:26 PM ?

## 2021-09-05 NOTE — Progress Notes (Addendum)
[facility] MD Progress Note ? ?09/05/2021 2:48 PM ?Brett Larsen  ?MRN:  [id number] ? ?Subjective:  Brett Larsen states: "My sleep was poor last night. I want the Trazodone back at 50. I am still irritable, the Abilify helps, but I feel like I need more." ? ?Reason for Admission:  Brett Larsen is a 26 year old male with a history of Brett disorder, PTSD, Brett, and ADHD who walked into [facility] on 09/02/21 with complaints of worsening depressive symptoms, SI and HI towards his girlfriend.  He was deemed to be a danger to self and others, and voluntarily admitted for treatment and stabilization of his mood and depressive symptoms. ? ?Today's assessment: Pt's chart is reviewed, his case discussed with the treatment team. Pt presents with a mildly anxious mood & affect is congruent.  He is somatically preoccupied. Attention to personal hygiene and grooming is fair, eye contact is good, speech is clear & coherent. Thought contents are organized and logical, and pt currently denies SI/HI/AVH or paranoia. There is no evidence of delusional thoughts.   ? ?Pt reports that his sleep quality last night was very poor, and requesting that his Trazodone be increased back to 50 mg nightly as needed. He is also reporting feeling irritable, and having fluctuations in his mood. Abilify is being increased to 10 mg daily to help with mood stabilization, with an additional 5 mg given today. Pt is also stating that Brett is not completely under control. He is ruminative about his labs and somatically preoccupied today with concerns that he needs to watch for SJS on Lamictal and after discussion that his LFTs were mildly elevated. Neurontin will be scheduled back at T[facility] to help with Brett. Continuous hospitalization continues to be necessary to monitor pt's response to these med changes. Pt denies being in any physical pain today, and denies any current side effects to medications. BP is currently elevated at 144/69, but a review of flow sheets show that  it has been WNL since admission. Will continue to monitor this. ? ?Principal Problem: Brett affective disorder, depressed, severe, with psychotic behavior ([place]) ?Diagnosis: Principal Problem: ?  Brett affective disorder, depressed, severe, with psychotic behavior ([place]) ?Active Problems: ?  Brett Larsen ?  Brett Larsen ?  Brett [facility] ? ?Total Time spent with patient: 20 minutes ? ?Past Psychiatric History: As above ? ?Past Medical History:  ?Past Medical History:  ?Diagnosis Date  ? Brett (attention deficit disorder)   ? Asthma   ? Chronic Brett   ? Mixed hyperglyceridemia   ? Psychogenic nonepileptic seizure   ?  ?Past Surgical History:  ?Procedure Laterality Date  ? CLOSED REDUCTION METACARPAL WITH PERCUTANEOUS PINNING Right 02/19/2021  ? Procedure: CLOSED REDUCTION METACARPAL WITH PERCUTANEOUS PINNING RIGHT RING AND SMALL METACARPAL BASE FRACTURE DISLOCATION;  Surgeon: Leanora Cover, MD;  Location: [facility];  Service: Orthopedics;  Laterality: Right;  ? WISDOM TOOTH EXTRACTION    ? ?Family History: History reviewed. No pertinent family history. ?Family Psychiatric  History: Brett d/o in mother ?Social History:  ?Social History  ? ?Substance and Sexual Activity  ?Alcohol Use Not Currently  ?   ?Social History  ? ?Substance and Sexual Activity  ?Drug Use Yes  ? Types: Other-see comments  ? Comment: edible THC Dela 8 (last used evening of 02/17/21)  ?  ?Social History  ? ?Socioeconomic History  ? Marital status: Single  ?  Spouse name: Not on file  ? Number of children: Not on file  ? Years of education:  Not on file  ? Highest education level: Not on file  ?Occupational History  ? Not on file  ?Tobacco Use  ? Smoking status: Former  ? Smokeless tobacco: Never  ?Vaping Use  ? Vaping Use: Not on file  ?Substance and Sexual Activity  ? Alcohol use: Not Currently  ? Drug use: Yes  ?  Types: Other-see comments  ?  Comment: edible THC Dela 8 (last used evening of 02/17/21)  ? Sexual  activity: Not on file  ?Other Topics Concern  ? Not on file  ?Social History Narrative  ? Not on file  ? ?Social Determinants of Health  ? ?Financial Resource Strain: Not on file  ?Food Insecurity: Not on file  ?Transportation Needs: Not on file  ?Physical Activity: Not on file  ?Stress: Not on file  ?Social Connections: Not on file  ? ?Additional Social History:  ?  ? Sleep: Poor per patient report ? ?Appetite:  Good ? ?Current Medications: ?Current Facility-Administered Medications  ?Medication Dose Route Frequency Provider Last Rate Last Admin  ? acetaminophen (TYLENOL) tablet 650 mg  650 mg Oral Q6H PRN Ntuen, Kris Hartmann, FNP      ? alum & mag hydroxide-simeth (MAALOX/MYLANTA) 200-200-20 MG/5ML suspension 30 mL  30 mL Oral Q4H PRN Ntuen, Kris Hartmann, FNP      ? [START ON 09/06/2021] ARIPiprazole (ABILIFY) tablet 10 mg  10 mg Oral Daily Nkwenti, Doris, NP      ? gabapentin (NEURONTIN) capsule 100 mg  100 mg Oral TID Nicholes Rough, NP   100 mg at 09/05/21 1437  ? hydrOXYzine (ATARAX) tablet 25 mg  25 mg Oral TID PRN Rozetta Nunnery, NP   25 mg at 09/05/21 1350  ? lamoTRIgine (LAMICTAL) tablet 50 mg  50 mg Oral Daily Nicholes Rough, NP   50 mg at 09/05/21 0758  ? magnesium hydroxide (MILK OF MAGNESIA) suspension 30 mL  30 mL Oral Daily PRN Ntuen, Kris Hartmann, FNP      ? traZODone (DESYREL) tablet 50 mg  50 mg Oral QHS PRN Nicholes Rough, NP      ? valACYclovir (VALTREX) tablet 1,000 mg  1,000 mg Oral Daily Nelda Marseille, Barry Faircloth E, MD   1,000 mg at 09/05/21 0758  ? Vitamin D (Ergocalciferol) (DRISDOL) capsule 50,000 Units  50,000 Units Oral Q7 days Harlow Asa, MD      ? ? ?Lab Results:  ?Results for orders placed or performed during the [facility] encounter of 09/02/21 (from the past 48 hour(s))  ?Urinalysis, Complete w Microscopic Urine, Clean Catch     Status: Abnormal  ? Collection Time: 09/03/21  6:53 PM  ?Result Value Ref Range  ? Color, Urine STRAW (A) YELLOW  ? APPearance CLEAR CLEAR  ? Specific Gravity, Urine 1.009 1.005 -  1.030  ? pH 6.0 5.0 - 8.0  ? Glucose, UA NEGATIVE NEGATIVE mg/dL  ? Hgb urine dipstick NEGATIVE NEGATIVE  ? Bilirubin Urine NEGATIVE NEGATIVE  ? Ketones, ur NEGATIVE NEGATIVE mg/dL  ? Protein, ur NEGATIVE NEGATIVE mg/dL  ? Nitrite NEGATIVE NEGATIVE  ? Leukocytes,Ua NEGATIVE NEGATIVE  ? RBC / HPF 0-5 0 - 5 RBC/hpf  ? WBC, UA 0-5 0 - 5 WBC/hpf  ? Bacteria, UA NONE SEEN NONE SEEN  ? Squamous Epithelial / LPF 0-5 0 - 5  ? Mucus PRESENT   ? Hyaline Casts, UA PRESENT   ?  Comment: Performed at [facility], [place] [address], [place], [place] [address]  ?Hepatitis panel, acute  Status: None  ? Collection Time: 09/04/21  6:30 PM  ?Result Value Ref Range  ? Hepatitis B Surface Ag NON REACTIVE NON REACTIVE  ? HCV Ab NON REACTIVE NON REACTIVE  ?  Comment: (NOTE) ?Nonreactive HCV antibody screen is consistent with no HCV infections,  ?unless recent infection is suspected or other evidence exists to ?indicate HCV infection. ? ?  ? Hep A IgM NON REACTIVE NON REACTIVE  ? Hep B C IgM NON REACTIVE NON REACTIVE  ?  Comment: Performed at [facility], [place] [address], [place], [place] [address]  ?VITAMIN D 25 Hydroxy (Vit-D Deficiency, Fractures)     Status: Abnormal  ? Collection Time: 09/04/21  6:30 PM  ?Result Value Ref Range  ? Vit D, 25-Hydroxy 22.95 (L) 30 - 100 ng/mL  ?  Comment: (NOTE) ?Vitamin D deficiency has been defined by the Institute of Medicine  ?and an Endocrine Society practice guideline as a level of serum 25-OH  ?vitamin D less than 20 ng/mL (1,2). The Endocrine Society went on to  ?further define vitamin D insufficiency as a level between 21 and 29  ?ng/mL (2). ? ?1. IOM [employer] of Medicine). 2010. Dietary reference intakes for  ?calcium and D. [place]: The [employer]. ?2. Holick MF, Binkley [place], Bischoff-Ferrari HA, et al. Evaluation,  ?treatment, and prevention of vitamin D deficiency: an Endocrine  ?Society clinical practice guideline, JCEM. 2011 Jul; 96(7):  1911-30. ? ?Performed at [facility], [place] [address] [place], [place] ?[address] ?  ?CBC with Differential/Platelet     Status: None  ? Collection Time: 09/04/21  6:30 PM  ?Result Value Ref Range  ? WBC 5

## 2021-09-05 NOTE — Group Note (Signed)
Occupational Therapy Group Note ? ? ?Group Topic:Goal Setting  ?Group Date: 09/05/2021 ?Start Time: 1400 ?End Time: 1500 ?Facilitators: Ted Mcalpine, OT  ? ?Group Description: Group encouraged engagement and participation through discussion focused on goal setting. Group members were introduced to goal-setting using the SMART Goal framework, identifying goals as Specific, Measureable, Acheivable, Relevant, and Time-Bound. Group members took time from group to create their own personal goal reflecting the SMART goal template and shared for review by peers and OT.   ? ?Therapeutic Goal(s):  ?Identify at least one goal that fits the SMART framework  ? ? ?Participation Level: Active ?  ?Participation Quality: Independent ?  ?Behavior: Appropriate ?  ?Speech/Thought Process: Organized and Relevant ?  ?Affect/Mood: Appropriate ?  ?Insight: Good ?  ?Judgement: Good ?  ?Individualization: pt was active and engaged in their participation of group discussion/activity. New goal and system building skills identified  ?Modes of Intervention: Discussion and Education  ?Patient Response to Interventions:  Attentive, Engaged, Interested , Receptive, and Requested additional information/resources  ?  ?Plan: Continue to engage patient in OT groups 2 - 3x/week. ? ?09/05/2021  ?Ted Mcalpine, OT ? ?Kerrin Champagne, OT ? ?

## 2021-09-05 NOTE — Progress Notes (Signed)
Patient denies SI, HI, and A/V/H. Patient currently denies any pain. Patient became highly anxious and somatic earlier today due to believing he had Trudie Buckler syndrome. Patient kept pointing to his tattoos on his arm stating he could possibly have a rash but no rash noted. Patient began panicking and began to escalate but was able to be redirected and VS were checked to provide reassurance. First set of Vs slightly elevated (BP 144/69, HR 75). Patient encouraged to deep breathe and given atarax po prn. After providing some time to help patient calm down, VS rechecked with bp back down to 120/66 and HR 72. Medication effective. Patient also apologized for "flipping out" and stated staring at the colors of his tattoos made him think about the rash from Trudie Buckler syndrome. Patient provided education regarding Trudie Buckler syndrome and is in no current distress. Q15 minute checks for safety.  ? ? 09/05/21 1500  ?Psych Admission Type (Psych Patients Only)  ?Admission Status Voluntary  ?Psychosocial Assessment  ?Patient Complaints Anxiety;Worrying  ?Eye Contact Fair  ?Facial Expression Anxious;Worried  ?Affect Anxious  ?Speech Logical/coherent  ?Interaction Assertive  ?Motor Activity Other (Comment) ?(appropriate)  ?Appearance/Hygiene Unremarkable  ?Behavior Characteristics Anxious;Cooperative  ?Mood Anxious  ?Thought Process  ?Coherency WDL  ?Content Paranoia  ?Delusions Paranoid;Somatic  ?Perception WDL  ?Hallucination None reported or observed  ?Judgment Poor  ?Confusion None  ?Danger to Self  ?Current suicidal ideation? Denies  ?Agreement Not to Harm Self Yes  ?Description of Agreement Verbal  ?Danger to Others  ?Danger to Others None reported or observed  ? ? ?

## 2021-09-05 NOTE — Plan of Care (Signed)
  Problem: Education: Goal: Knowledge of the prescribed therapeutic regimen will improve Outcome: Progressing   Problem: Coping: Goal: Will verbalize feelings Outcome: Progressing   Problem: Health Behavior/Discharge Planning: Goal: Compliance with therapeutic regimen will improve Outcome: Progressing   

## 2021-09-05 NOTE — Progress Notes (Signed)
Patient is pleasantly disorganized, c/o insomnia prn Trazodone PO given at HS and effective. Denies SI/HI/A/VH and verbally contracted for safety. Patient in bed sleeping respirations noted. Q 15 minutes safety checks ongoing.  ?

## 2021-09-06 DIAGNOSIS — F315 Bipolar disorder, current episode depressed, severe, with psychotic features: Secondary | ICD-10-CM

## 2021-09-06 LAB — VITAMIN B1: Vitamin B1 (Thiamine): 167.9 nmol/L (ref 66.5–200.0)

## 2021-09-06 MED ORDER — TRAZODONE HCL 50 MG PO TABS: 50.0000 mg | ORAL_TABLET | Freq: Every evening | ORAL | 0 refills | Status: AC | PRN

## 2021-09-06 MED ORDER — GABAPENTIN 100 MG PO CAPS: 100.0000 mg | ORAL_CAPSULE | Freq: Three times a day (TID) | ORAL | 0 refills | Status: AC

## 2021-09-06 MED ORDER — VITAMIN D (ERGOCALCIFEROL) 1.25 MG (50000 UNIT) PO CAPS: 50000.0000 [IU] | ORAL_CAPSULE | ORAL | 0 refills | Status: AC

## 2021-09-06 MED ORDER — HYDROXYZINE HCL 25 MG PO TABS: 25.0000 mg | ORAL_TABLET | Freq: Three times a day (TID) | ORAL | 0 refills | Status: AC | PRN

## 2021-09-06 MED ORDER — ARIPIPRAZOLE 10 MG PO TABS: 10.0000 mg | ORAL_TABLET | Freq: Every day | ORAL | 0 refills | Status: AC

## 2021-09-06 MED ORDER — GABAPENTIN 100 MG PO CAPS: 100.0000 mg | ORAL_CAPSULE | Freq: Three times a day (TID) | ORAL | 0 refills | Status: DC

## 2021-09-06 NOTE — Discharge Summary (Signed)
Physician Discharge Summary Note ? ?Patient:  Brett Larsen is an 26 y.o., male ?MRN:  [id number] ?DOB:  10-16-1995 ?Patient phone:  [phone] (home)  ?Patient address:   ?[address] ?[place] [address],  ?Total Time spent with patient: 30 minutes ? ?Date of Admission:  09/02/2021 ?Date of Discharge: 09/06/21 ? ?Reason for Admission:  Brett Larsen is a 26 year old male with a history of bipolar disorder, PTSD, anxiety, and ADHD who walked into [facility] on [facility]/21 with complaints of worsening depressive symptoms, SI and HI towards his girlfriend. ? ?Principal Problem: Bipolar affective disorder, depressed, severe, with psychotic behavior ([facility]) ?Discharge Diagnoses: Principal Problem: ?  Bipolar affective disorder, depressed, severe, with psychotic behavior ([facility]) ?Active Problems: ?  Anxiety disorder, unspecified ?  Insomnia ?  Herpes simplex ? ? ?Past Psychiatric History: Bipolar d/o, GAD, PTSD ? ?Past Medical History:  ?Past Medical History:  ?Diagnosis Date  ? ADD (attention deficit disorder)   ? Asthma   ? Chronic anxiety   ? Mixed hyperglyceridemia   ? Psychogenic nonepileptic seizure   ?  ?Past Surgical History:  ?Procedure Laterality Date  ? CLOSED REDUCTION METACARPAL WITH PERCUTANEOUS PINNING Right 02/19/2021  ? Procedure: CLOSED REDUCTION METACARPAL WITH PERCUTANEOUS PINNING RIGHT RING AND SMALL METACARPAL BASE FRACTURE DISLOCATION;  Surgeon: Betha Loa, MD;  Location: [facility];  Service: Orthopedics;  Laterality: Right;  ? WISDOM TOOTH EXTRACTION    ? ?Family History: History reviewed. No pertinent family history. ?Family Psychiatric  History:  yes, bio mother with bipolar d/o ?Social History:  ?Social History  ? ?Substance and Sexual Activity  ?Alcohol Use Not Currently  ?   ?Social History  ? ?Substance and Sexual Activity  ?Drug Use Yes  ? Types: Other-see comments  ? Comment: edible THC Dela 8 (last used evening of 02/17/21)  ?  ?Social History  ? ?Socioeconomic History  ?  Marital status: Single  ?  Spouse name: Not on file  ? Number of children: Not on file  ? Years of education: Not on file  ? Highest education level: Not on file  ?Occupational History  ? Not on file  ?Tobacco Use  ? Smoking status: Former  ? Smokeless tobacco: Never  ?Vaping Use  ? Vaping Use: Not on file  ?Substance and Sexual Activity  ? Alcohol use: Not Currently  ? Drug use: Yes  ?  Types: Other-see comments  ?  Comment: edible THC Dela 8 (last used evening of 02/17/21)  ? Sexual activity: Not on file  ?Other Topics Concern  ? Not on file  ?Social History Narrative  ? Not on file  ? ?Social Determinants of Health  ? ?Financial Resource Strain: Not on file  ?Food Insecurity: Not on file  ?Transportation Needs: Not on file  ?Physical Activity: Not on file  ?Stress: Not on file  ?Social Connections: Not on file  ? ? ?Hospital Course:  HOSPITAL COURSE: ?  ?During the patient's hospitalization, patient had extensive initial psychiatric evaluation, and follow-up psychiatric evaluations every day. ?  ?Psychiatric diagnoses provided at discharge:  ? Severe bipolar I disorder, current or most recent episode depressed ([facility]) ?Active Problems: ?  Insomnia ?  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  ?Patient's psychiatric medications were adjusted on admission:  ?Started Abilify 5 mg nightly for mood stabilization ?-Continued Gabapentin 100 mg TID for anxiety ?-Continued Lamictal 50 mg daily  for mood stabilization ?-Got one time dose of Depakote DR 125 mg (completed) ?-Continued Hydroxyzine 25 mg every 6 hours PRN ?-Continued Trazodone 50 mg nightly PRN for insomnia ?-Continued Valtrex 1000 mg daily for Herpes simplex virus ?During the hospitalization, other adjustments were made to the patient's psychiatric medication regimen: Increased Abilify from 5 mg to 10 mg daily for mood stabilization  ?-Changed Gabapentin to 100 mg  TID for anxiety and help with sleep ?-Continued Lamictal 50 mg daily for mood stabilization ?-Continued Hydroxyzine 25 mg every 6 hours PRN ?- Patient declines start of an antidepressant at this time ?-Increased Trazodone from 25 mg  to 50 mg nightly PRN for insomnia  ?  ?Patient's care was discussed during the interdisciplinary team meeting every day during the hospitalization. ?  ?The patient denies having side effects to prescribed psychiatric medication. ?  ?Gradually, patient started adjusting to milieu. The patient was evaluated each day by a clinical provider to ascertain response to treatment. Improvement was noted by the patient's report of decreasing symptoms, improved sleep and appetite, affect, medication tolerance, behavior, and participation in unit programming.  Patient was asked each day to complete a self inventory noting mood, mental status, pain, new symptoms, anxiety and concerns.   ?Symptoms were reported as significantly decreased or resolved completely by discharge.  ?The patient reports that their mood is stable.  ?The patient denied having suicidal thoughts for more than 48 hours prior to discharge.  Patient denies having homicidal thoughts.  Patient denies having auditory hallucinations.  Patient denies any visual hallucinations or other symptoms of psychosis.  ?The patient was motivated to continue taking medication with a goal of continued improvement in mental health.  ?  ?The patient reports their target psychiatric symptoms of Bipolar responded well to the psychiatric medications, and the patient reports overall benefit other psychiatric hospitalization. Supportive psychotherapy was provided to the patient. The patient also participated in regular group therapy while hospitalized. Coping skills, problem solving as well as relaxation therapies were also part of the unit programming. ?  ?Labs were reviewed with the patient, and abnormal results were discussed with the patient. ?  ?The  patient is able to verbalize their individual safety plan to this provider. ?  ?# It is recommended to the patient to continue psychiatric medications as prescribed, after discharge from the hospital.   ?  ?# It is recommended to the patient to follow up with your outpatient psychiatric provider and PCP. ?  ?#The risks of potentially developing TD/EPS, weight gain, blood dyscrasias, EKG changes, DM, and high cholesterol while taking an antipsychotic were discussed in detail with the patient. The patient verbalized understanding of discussed risks. The patient was made aware of the need for ongoing lab, AIMS, EKG, and weight monitoring with use of an antipsychotic.   ?  ?# discussed risks and side effects of Lamictal including rash and Viviann SpareSteven Johnson's syndrome.  ?  ?# It was discussed with the patient, the impact of alcohol, drugs, tobacco have been there overall psychiatric and medical wellbeing, and total abstinence from substance use was recommended the patient.ed. ?  ?# Prescriptions provided or sent directly to preferred pharmacy at discharge. Patient agreeable to plan. Given opportunity to ask questions. Appears to feel comfortable with discharge.  ?  ?# In the event of worsening symptoms, the patient is instructed to call the crisis hotline, 911 and or go to the nearest ED for appropriate evaluation and treatment of symptoms. To follow-up with primary care provider  for other medical issues, concerns and or health care needs. ?  ?#Patient seen by this MD. At time of discharge, consistently refuted any suicidal ideation, intention or plan, denies any Self harm urges. Denies any A/VH and no delusions were elicited and does not seem to be responding to internal stimuli. During assessment the patient is able to verbalize appropriated coping skills and safety plan to use on return home. Patient verbalizes intent to be compliant with medication and outpatient services.  ?  ?# Patient was discharged home with a plan  to follow up as noted below.  ? ?Physical Findings: ?AIMS: Facial and Oral Movements ?Muscles of Facial Expression: None, normal ?Lips and Perioral Area: None, normal ?Jaw: None, normal ?Tongue: None, no

## 2021-09-06 NOTE — Progress Notes (Signed)
Patient is discharging at this time. Patient A&Ox4. Patient currently denies SI,HI, and A/V/H. Printed AVS reviewed with and given to patient along with medications and follow up appointments. Patient verbalized understanding. All belongings from locker #46 returned to patient. Patient is being transported by his mother. Patient denies any pain with no s/s of current distress.  ?

## 2021-09-06 NOTE — [facility] Suicide Risk Assessment (Addendum)
Suicide Risk Assessment ? ?Discharge Assessment    ?[facility] Discharge Suicide Risk Assessment ? ? ?Principal Problem: Bipolar affective disorder, depressed, severe, with psychotic behavior (HCC) ?Discharge Diagnoses: Principal Problem: ?  Bipolar affective disorder, depressed, severe, with psychotic behavior (HCC) ?Active Problems: ?  Anxiety disorder, unspecified ?  Insomnia ?  Herpes simplex ? ? ?Total Time spent with patient: 30 minutes ?HOSPITAL COURSE: ? ?During the patient's hospitalization, patient had extensive initial psychiatric evaluation, and follow-up psychiatric evaluations every day. ? ?Psychiatric diagnoses provided at discharge:  ? Severe bipolar I disorder, current or most recent episode depressed (HCC) ?Active Problems: ?  Insomnia ?  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  ?Patient's psychiatric medications were adjusted on admission:  ?Started Abilify 5 mg nightly for mood stabilization ?-Continued Gabapentin 100 mg TID for anxiety ?-Continued Lamictal 50 mg daily for mood stabilization ?-Got one time dose of Depakote DR 125 mg (completed) ?-Continued Hydroxyzine 25 mg every 6 hours PRN ?-Continued Trazodone 50 mg nightly PRN for insomnia ?-Continued Valtrex 1000 mg daily for Herpes simplex virus ?During the hospitalization, other adjustments were made to the patient's psychiatric medication regimen: Increased Abilify from 5 mg to 10 mg daily for mood stabilization  ?-Changed Gabapentin to 100 mg TID for anxiety and help with sleep ?-Continued Lamictal 50 mg daily for mood stabilization ?-Continued Hydroxyzine 25 mg every 6 hours PRN ?- Patient declines start of an antidepressant at this time ?-Increased Trazodone from 25 mg  to 50 mg nightly PRN for insomnia  ? ?Patient's care was discussed during the interdisciplinary team meeting every day during the hospitalization. ? ?The patient denies  having side effects to prescribed psychiatric medication. ? ?Gradually, patient started adjusting to milieu. The patient was evaluated each day by a clinical provider to ascertain response to treatment. Improvement was noted by the patient's report of decreasing symptoms, improved sleep and appetite, affect, medication tolerance, behavior, and participation in unit programming.  Patient was asked each day to complete a self inventory noting mood, mental status, pain, new symptoms, anxiety and concerns.   ?Symptoms were reported as significantly decreased or resolved completely by discharge.  ?The patient reports that their mood is stable.  ?The patient denied having suicidal thoughts for more than 48 hours prior to discharge.  Patient denies having homicidal thoughts.  Patient denies having auditory hallucinations.  Patient denies any visual hallucinations or other symptoms of psychosis.  ?The patient was motivated to continue taking medication with a goal of continued improvement in mental health.  ? ?The patient reports their target psychiatric symptoms of Bipolar responded well to the psychiatric medications, and the patient reports overall benefit other psychiatric hospitalization. Supportive psychotherapy was provided to the patient. The patient also participated in regular group therapy while hospitalized. Coping skills, problem solving as well as relaxation therapies were also part of the unit programming. ? ?Labs were reviewed with the patient, and abnormal results were discussed with the patient. ? ?The patient is able to verbalize their individual safety plan to this provider. ? ?# It is recommended to the patient to continue psychiatric medications as prescribed, after discharge from the hospital.   ? ?# It is recommended to the patient to follow up with your outpatient psychiatric provider and PCP. ? ?#The risks of potentially developing TD/EPS, weight gain, blood dyscrasias, EKG changes, DM, and high  cholesterol while taking an antipsychotic were discussed  in detail with the patient. The patient verbalized understanding of discussed risks. The patient was made aware of the need for ongoing lab, AIMS, EKG, and weight monitoring with use of an antipsychotic.   ? ?# discussed risks and side effects of Lamictal including rash and Viviann SpareSteven Johnson's syndrome.  ? ?# It was discussed with the patient, the impact of alcohol, drugs, tobacco have been there overall psychiatric and medical wellbeing, and total abstinence from substance use was recommended the patient.ed. ? ?# Prescriptions provided or sent directly to preferred pharmacy at discharge. Patient agreeable to plan. Given opportunity to ask questions. Appears to feel comfortable with discharge.  ?  ?# In the event of worsening symptoms, the patient is instructed to call the crisis hotline, 911 and or go to the nearest ED for appropriate evaluation and treatment of symptoms. To follow-up with primary care provider for other medical issues, concerns and or health care needs. ? ?#Patient seen by this MD. At time of discharge, consistently refuted any suicidal ideation, intention or plan, denies any Self harm urges. Denies any A/VH and no delusions were elicited and does not seem to be responding to internal stimuli. During assessment the patient is able to verbalize appropriated coping skills and safety plan to use on return home. Patient verbalizes intent to be compliant with medication and outpatient services.  ? ?# Patient was discharged home with a plan to follow up as noted below.  ?Musculoskeletal: ?Strength & Muscle Tone: within normal limits ?Gait & Station: normal ?Patient leans: N/A ? ?Psychiatric Specialty Exam ? ?Presentation  ?General Appearance: Fairly Groomed ? ?Eye Contact:Good ? ?Speech:Clear and Coherent ? ?Speech Volume:Normal ? ?Handedness:Right ? ? ?Mood and Affect  ?Mood:Euthymic ? ?Duration of Depression Symptoms: No data  recorded ?Affect:Appropriate ? ? ?Thought Process  ?Thought Processes:Coherent ? ?Descriptions of Associations:Intact ? ?Orientation:Full (Time, Place and Person) ? ?Thought Content:Logical ? ?History of Schizophrenia/Schizoaffective disorder:No data recorded ?Duration of Psychotic Symptoms:No data recorded ?Hallucinations:Hallucinations: None ? ?Ideas of Reference:None ? ?Suicidal Thoughts:Suicidal Thoughts: No ? ?Homicidal Thoughts:Homicidal Thoughts: No ? ? ?Sensorium  ?Memory:Immediate Good; Recent Good; Remote Good ? ?Judgment:Fair ? ?Insight:Fair ? ? ?Executive Functions  ?Concentration:Fair ? ?Attention Span:Good ? ?Recall:Good ? ?Fund of Knowledge:Good ? ?Language:Good ? ? ?Psychomotor Activity  ?Psychomotor Activity:Psychomotor Activity: Normal ? ? ?Assets  ?Assets:Housing; Social Support ? ? ?Sleep  ?Sleep:Sleep: Good ?Number of Hours of Sleep: 7.25 ? ? ?Physical Exam: ?Physical Exam see d/c summary ?ROS see d/c summary ?Blood pressure 114/70, pulse 86, temperature 97.6 ?F (36.4 ?C), resp. rate 18, height 5\' 8"  (1.727 m), weight 83.9 kg, SpO2 97 %. Body mass index is 28.13 kg/m?. ? ?Mental Status Per Nursing Assessment::   ?On Admission:  Thoughts of violence towards others ? ?Demographic factors:  Male, Adolescent or young adult, Caucasian, Low socioeconomic status ?Current Mental Status:  Thoughts of violence towards others ?Loss Factors:  Decrease in vocational status ?Historical Factors:  NA ?Risk Reduction Factors:  Living with another person, especially a relative ? ?Continued Clinical Symptoms:  ?Severe Anxiety and/or Agitation ?Bipolar Disorder:   Depressive phase ?Previous Psychiatric Diagnoses and Treatments ?Medical Diagnoses and Treatments/Surgeries ? ?Cognitive Features That Contribute To Risk:  ?Closed-mindedness and Thought constriction (tunnel vision)   ? ?Suicide Risk:  ?Mild:  There are no identifiable plans, no associated intent, mild related symptoms, good self-control (both objective  and subjective assessment), few other risk factors, and identifiable protective factors, including available and accessible social support. ? ? Follow-up Information   ? ?  [facility] Follow [facility] on 09/10/2021.   ?Why:

## 2021-09-06 NOTE — Group Note (Signed)
Date:  09/06/2021 ?Time:  10:32 AM ? ?Group Topic/Focus:  ?Orientation:   The focus of this group is to educate the patient on the purpose and policies of crisis stabilization and provide a format to answer questions about their admission.  The group details unit policies and expectations of patients while admitted. ? ? ? ?Participation Level:  Active ? ?Participation Quality:  Appropriate ? ?Affect:  Appropriate ? ?Cognitive:  Appropriate ? ?Insight: Appropriate ? ?Engagement in Group:  Engaged ? ?Modes of Intervention:  Discussion ? ?Additional Comments:   ? ?Jaquita Rector ?09/06/2021, 10:32 AM ? ?

## 2021-09-06 NOTE — Plan of Care (Signed)
?  Problem: Education: ?Goal: Knowledge of the prescribed therapeutic regimen will improve ?Outcome: Progressing ?  ?Problem: Coping: ?Goal: Will verbalize feelings ?Outcome: Progressing ?  ?Problem: Safety: ?Goal: Ability to disclose and discuss suicidal ideas will improve ?Outcome: Progressing ?Goal: Ability to identify and utilize support systems that promote safety will improve ?Outcome: Progressing ?  ?

## 2021-09-06 NOTE — Group Note (Unsigned)
Date:  09/06/2021 ?Time:  10:29 AM ? ?Group Topic/Focus:  ?Orientation:   The focus of this group is to educate the patient on the purpose and policies of crisis stabilization and provide a format to answer questions about their admission.  The group details unit policies and expectations of patients while admitted. ? ? ? ? ?Participation Level:  {[facility] PARTICIPATION LEVEL:22264} ? ?Participation Quality:  {[facility] PARTICIPATION QUALITY:22265} ? ?Affect:  {[facility] AFFECT:22266} ? ?Cognitive:  {[facility] COGNITIVE:22267} ? ?Insight: {[facility] Insight2:20797} ? ?Engagement in Group:  {[facility] ENGAGEMENT IN GROUP:22268} ? ?Modes of Intervention:  {[facility] MODES OF INTERVENTION:22269} ? ?Additional Comments:  *** ? ?Brett Larsen ?09/06/2021, 10:29 AM ? ?

## 2021-09-06 NOTE — Progress Notes (Deleted)
?  [facility] Adult Case Management Discharge Plan : ? ?Will you be returning to the same living situation after discharge:  Yes,  Home with mother  ?At discharge, do you have transportation home?: Yes,  Mother  ?Do you have the ability to pay for your medications: Yes,  Medicaid  ? ?Release of information consent forms completed and in the chart;  Patient's signature needed at discharge. ? ?Patient to Follow up at: ? Follow-up Information   ? ? [facility] Follow up on 09/10/2021.   ?Why: You are scheduled for a therapy appointment on 09/10/2021 at 10:00am and for a medication management appointment on 09/11/2021 at 1:00pm.  These appointments will be held in-person. ?Contact information: ?Address: [address], [place], [place] [address] ? ?Phone: [phone] ?Fax: [phone] ? ?  ?  ? ? [facility] Follow up on 09/10/2021.   ?Why: You are scheduled for Physical Therapy on 09/10/21, 09/12/21, 09/17/21, and 09/19/21.  All appointments are scheduled at 10:00am and in-person. ?Contact information: ?Address: [address] [place], [place], [place] [address] ? ?Phone: [phone] ?Fax: [phone] ? ?  ?  ? ?  ?  ? ?  ? ? ?Next level of care provider has access to [facility] Link:no ? ?Safety Planning and Suicide Prevention discussed: Yes,  with patient and mother  ? ?  ? ?Has patient been referred to the Quitline?: N/A patient is not a smoker ? ?Patient has been referred for addiction treatment: Pt. refused referral ? ?Aram Beecham, LCSWA ?09/06/2021, 9:41 AM ?

## 2021-09-06 NOTE — Progress Notes (Signed)
?  [facility] Adult Case Management Discharge Plan : ? ?Will you be returning to the same living situation after discharge:  Yes,  Home with mother  ?At discharge, do you have transportation home?: Yes,  Mother  ?Do you have the ability to pay for your medications: Yes,  Medicaid  ? ?Release of information consent forms completed and in the chart;  Patient's signature needed at discharge. ? ?Patient to Follow up at: ? Follow-up Information   ? ? [facility] Follow up on 09/10/2021.   ?Why: You are scheduled for a therapy appointment on 09/10/2021 at 10:00am and for a medication management appointment on 09/11/2021 at 1:00pm.  These appointments will be held in-person. ?Contact information: ?Address: [address], [place], [place] [address] ?Phone: [phone] ?Fax: [phone] ? ?  ?  ? ? [facility] Follow up on 09/10/2021.   ?Why: You are scheduled for Physical Therapy on 09/10/21, 09/12/21, 09/17/21, and 09/19/21.  All appointments are scheduled at 10:00am and in-person. ?Contact information: ?Address: [address] [place], [place], [place] [address] ? ?Phone: [phone] ?Fax: [phone] ? ?  ?  ? ?  ?  ? ?  ? ? ?Next level of care provider has access to [facility] Link:no ? ?Safety Planning and Suicide Prevention discussed: Yes,  with patient and mother  ? ?  ? ?Has patient been referred to the Quitline?: N/A patient is not a smoker ? ?Patient has been referred for addiction treatment: Pt. refused referral ? ?Aram Beecham, LCSWA ?09/06/2021, 11:24 AM ?

## 2021-09-06 NOTE — Progress Notes (Signed)
Patient visible in Milieu this shift interacting well with Peers and Staff, endorses Passive SI with no Plan. Compliant with medication. ? ?Q 15 Minutes safety checks ongoing without self harm gestures. No adverse drug noted. Support and encouragement provided.  ?

## 2023-01-09 IMAGING — CR DG HAND COMPLETE 3+V*R*
3 series · 3 of 3 positions shown · non-contrast
Comparison: None.

CLINICAL DATA: Right hand pain and swelling.

EXAM:
RIGHT HAND - COMPLETE 3+ VIEW

[x hand pa right]
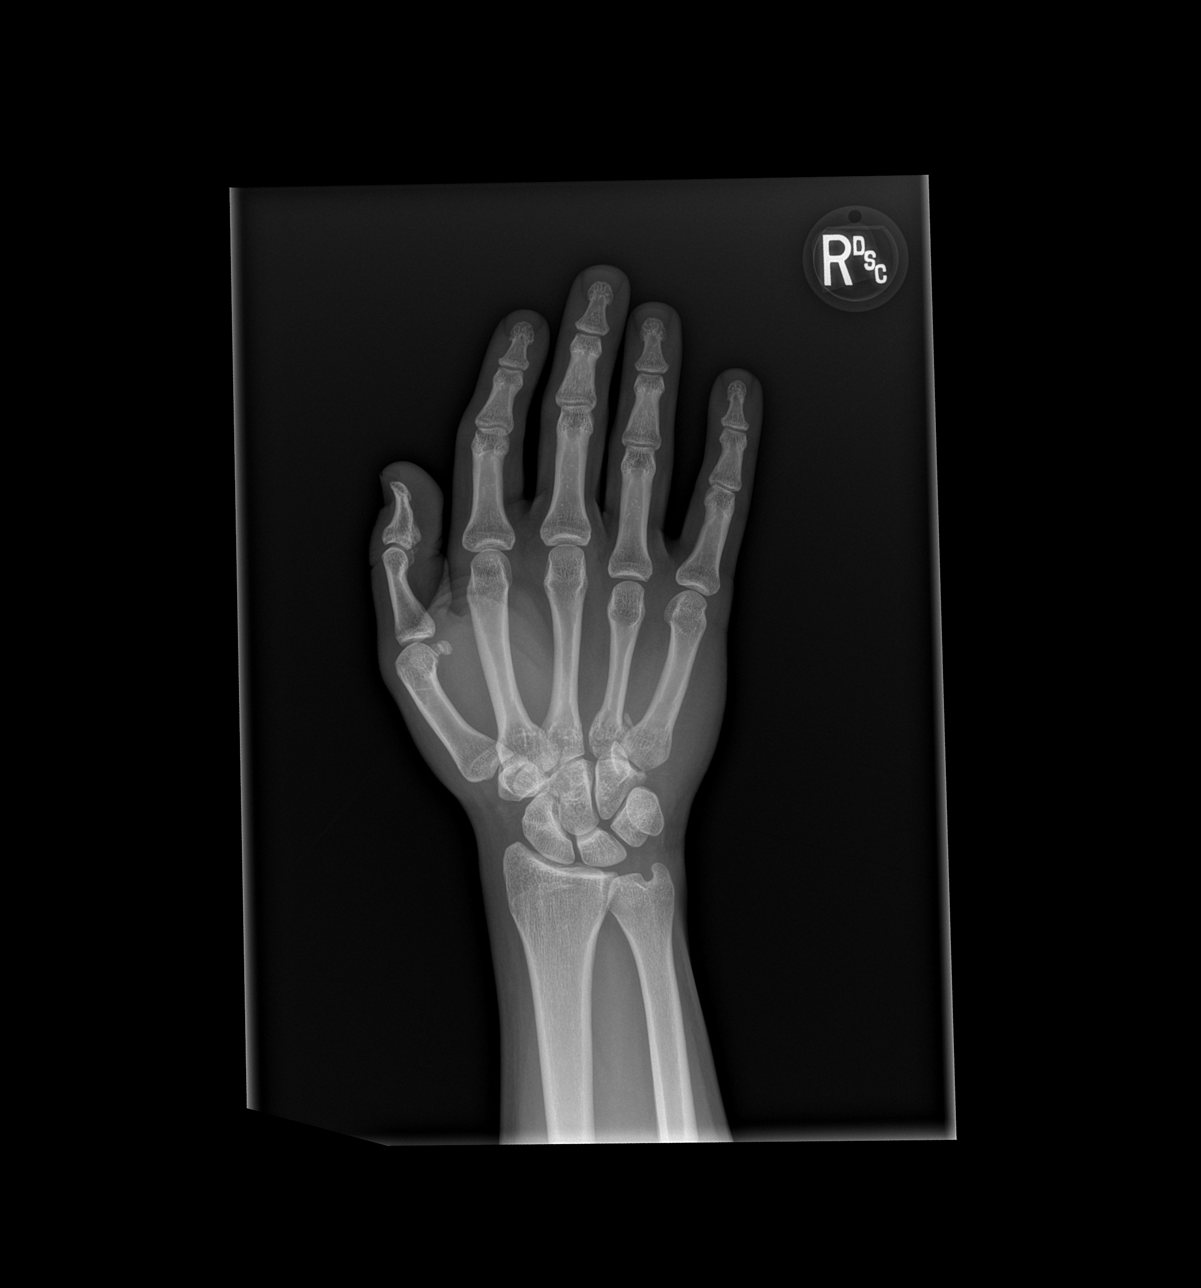

[x hand obl right]
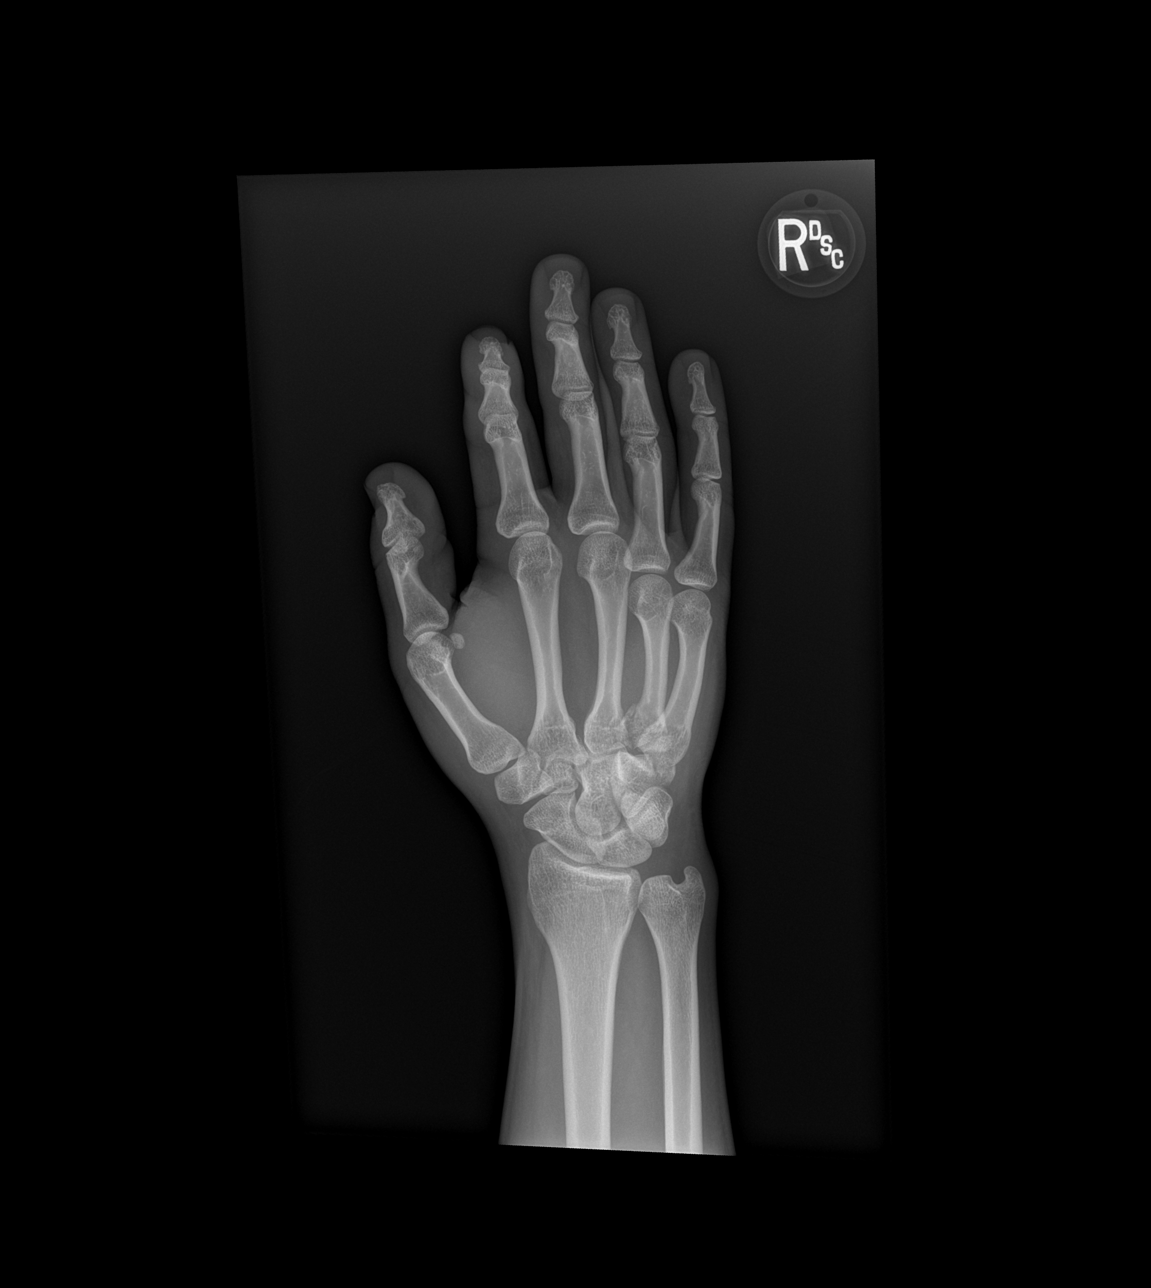

[x hand lat right]
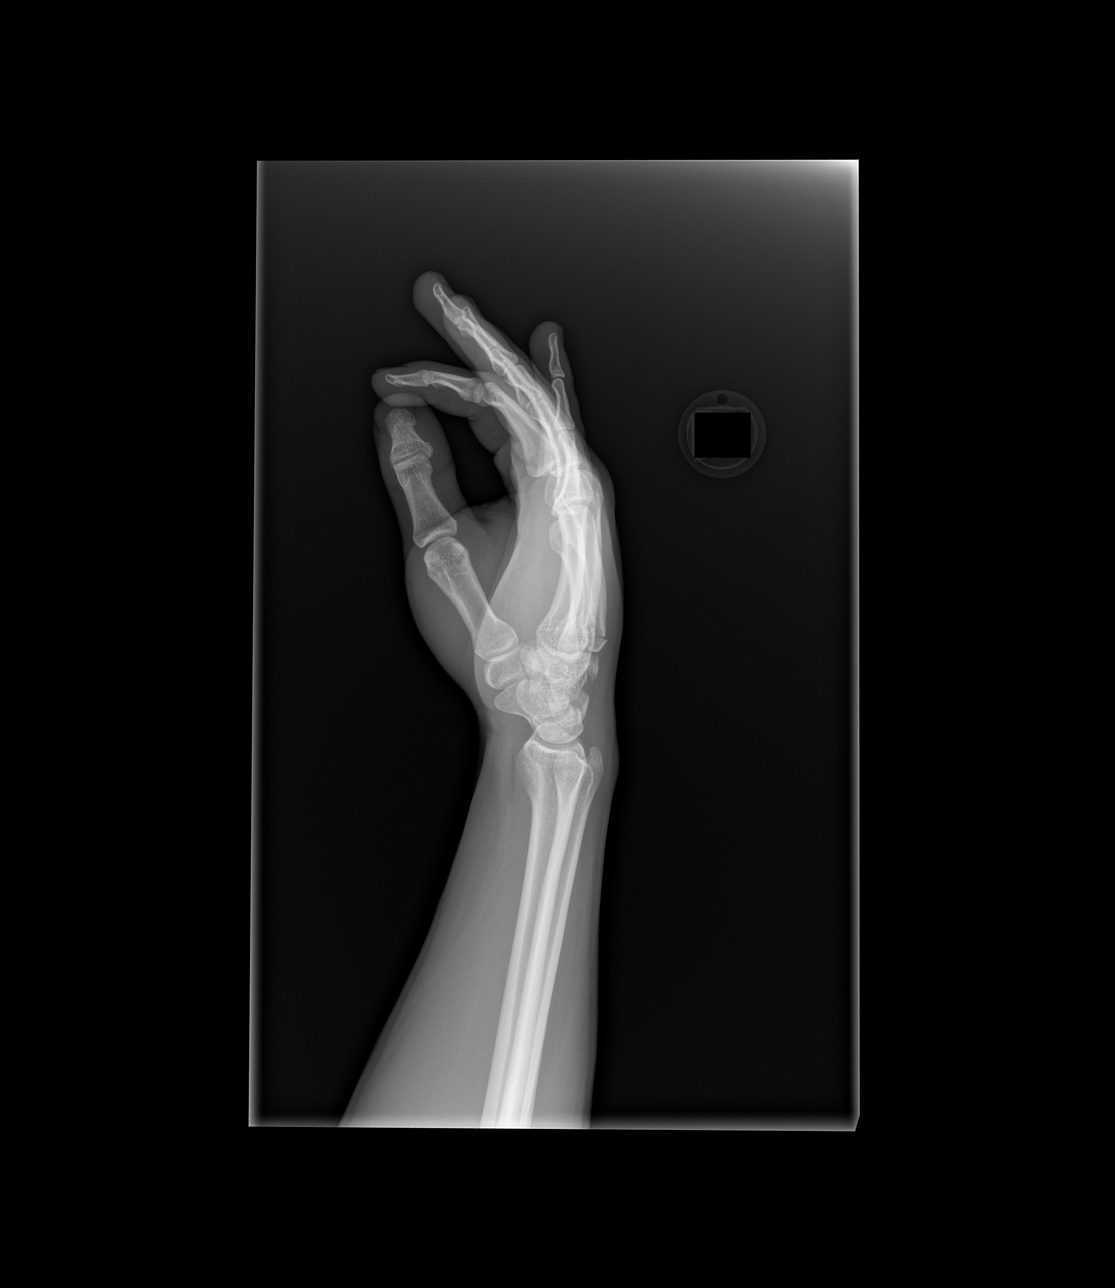

[3 of 3 positions shown; findings below may reference images not displayed]

FINDINGS: There is an acute comminuted fracture at the base of the fourth
metacarpal. Small free fracture fragment is is seen inferior to the
metacarpophalangeal joint space dorsally which may represent
triquetral fracture. There is no dislocation. There is soft tissue
swelling over the dorsum of the hand.
IMPRESSION: 1.  Acute fracture base of the fourth metacarpal.

2. Questionable displaced fracture fragment from fourth metacarpal
fracture versus additional triquetral fracture.
